# Patient Record
Sex: Female | Born: 1975 | Race: White | Hispanic: No | Marital: Married | Smoking: Never smoker
Health system: Southern US, Community
[De-identification: ages and names within clinical notes are randomized; demographics above are authoritative.]

## PROBLEM LIST (undated history)

## (undated) DIAGNOSIS — F419 Anxiety disorder, unspecified: Secondary | ICD-10-CM

## (undated) DIAGNOSIS — I10 Essential (primary) hypertension: Secondary | ICD-10-CM

---

## 2012-04-30 ENCOUNTER — Ambulatory Visit: Payer: Self-pay | Admitting: Obstetrics and Gynecology

## 2013-12-26 ENCOUNTER — Observation Stay: Payer: Self-pay | Admitting: Obstetrics & Gynecology

## 2013-12-26 LAB — PIH PROFILE
Anion Gap: 5 — ABNORMAL LOW (ref 7–16)
BUN: 8 mg/dL (ref 7–18)
CREATININE: 0.59 mg/dL — AB (ref 0.60–1.30)
Calcium, Total: 9.2 mg/dL (ref 8.5–10.1)
Chloride: 106 mmol/L (ref 98–107)
Co2: 25 mmol/L (ref 21–32)
EGFR (African American): >60
EGFR (Non-African Amer.): >60
Glucose: 94 mg/dL (ref 65–99)
HCT: 33.5 % — ABNORMAL LOW (ref 35.0–47.0)
HGB: 11.1 g/dL — ABNORMAL LOW (ref 12.0–16.0)
MCH: 28.9 pg (ref 26.0–34.0)
MCHC: 33.2 g/dL (ref 32.0–36.0)
MCV: 87 fL (ref 80–100)
Osmolality: 270 (ref 275–301)
Platelet: 198 10*3/uL (ref 150–440)
Potassium: 4.5 mmol/L (ref 3.5–5.1)
RBC: 3.85 10*6/uL (ref 3.80–5.20)
RDW: 16.7 % — ABNORMAL HIGH (ref 11.5–14.5)
SGOT(AST): 21 U/L (ref 15–37)
Sodium: 136 mmol/L (ref 136–145)
Uric Acid: 3.7 mg/dL (ref 2.6–6.0)
WBC: 10.7 10*3/uL (ref 3.6–11.0)

## 2013-12-26 LAB — PROTEIN / CREATININE RATIO, URINE
Creatinine, Urine: 57.4 mg/dL (ref 30.0–125.0)
PROTEIN, RANDOM URINE: 19 mg/dL — AB (ref 0–12)
Protein/Creat. Ratio: 331 mg/gCREAT — ABNORMAL HIGH (ref 0–200)

## 2013-12-28 ENCOUNTER — Observation Stay: Payer: Self-pay | Admitting: Obstetrics and Gynecology

## 2013-12-28 LAB — CBC
HCT: 33.9 % — ABNORMAL LOW (ref 35.0–47.0)
HGB: 11.3 g/dL — ABNORMAL LOW (ref 12.0–16.0)
MCH: 29.1 pg (ref 26.0–34.0)
MCHC: 33.3 g/dL (ref 32.0–36.0)
MCV: 87 fL (ref 80–100)
PLATELETS: 206 10*3/uL (ref 150–440)
RBC: 3.87 10*6/uL (ref 3.80–5.20)
RDW: 16.7 % — ABNORMAL HIGH (ref 11.5–14.5)
WBC: 9.9 10*3/uL (ref 3.6–11.0)

## 2013-12-28 LAB — PROTEIN / CREATININE RATIO, URINE
CREATININE, URINE: 88.4 mg/dL (ref 30.0–125.0)
PROTEIN, RANDOM URINE: 21 mg/dL — AB (ref 0–12)
PROTEIN/CREAT. RATIO: 238 mg/g{creat} — AB (ref 0–200)

## 2013-12-28 LAB — PROTEIN, URINE, 24 HOUR
Collection Hours: 24 hours
Protein, 24 Hour Urine: 530 mg/24HR — ABNORMAL HIGH (ref 30–149)
Protein, Urine: 20 mg/dL (ref 0–12)
Total Volume: 2650 mL

## 2013-12-29 ENCOUNTER — Inpatient Hospital Stay: Payer: Self-pay

## 2013-12-29 LAB — PIH PROFILE
ANION GAP: 10 (ref 7–16)
BUN: 8 mg/dL (ref 7–18)
CALCIUM: 9.5 mg/dL (ref 8.5–10.1)
Chloride: 105 mmol/L (ref 98–107)
Co2: 23 mmol/L (ref 21–32)
Creatinine: 0.59 mg/dL — ABNORMAL LOW (ref 0.60–1.30)
EGFR (African American): >60
EGFR (Non-African Amer.): >60
Glucose: 106 mg/dL — ABNORMAL HIGH (ref 65–99)
HCT: 32.4 % — AB (ref 35.0–47.0)
HGB: 10.8 g/dL — AB (ref 12.0–16.0)
MCH: 29 pg (ref 26.0–34.0)
MCHC: 33.4 g/dL (ref 32.0–36.0)
MCV: 87 fL (ref 80–100)
Osmolality: 274 (ref 275–301)
POTASSIUM: 4 mmol/L (ref 3.5–5.1)
Platelet: 196 10*3/uL (ref 150–440)
RBC: 3.74 10*6/uL — ABNORMAL LOW (ref 3.80–5.20)
RDW: 16.5 % — ABNORMAL HIGH (ref 11.5–14.5)
SGOT(AST): 20 U/L (ref 15–37)
SODIUM: 138 mmol/L (ref 136–145)
Uric Acid: 3.7 mg/dL (ref 2.6–6.0)
WBC: 9.7 10*3/uL (ref 3.6–11.0)

## 2013-12-31 LAB — PLATELET COUNT: Platelet: 197 10*3/uL (ref 150–440)

## 2014-01-02 LAB — HEMATOCRIT: HCT: 27.7 % — AB (ref 35.0–47.0)

## 2014-01-02 LAB — PROT IMMUNOELECT,UR-24HR

## 2014-11-01 NOTE — Op Note (Signed)
PATIENT NAME:  Mary Clayton, Mary Clayton MR#:  [id number] DATE OF BIRTH:  21-Aug-1975  DATE OF PROCEDURE:  01/01/2014  PREOPERATIVE DIAGNOSES:  1.  Intrauterine pregnancy at 40 weeks 6 days gestational age.  2.  Secondary arrest of arrest of dilatation.   POSTOPERATIVE DIAGNOSES: 1.  Intrauterine pregnancy at 40 weeks 6 days gestational age.  2.  Secondary arrest of arrest of dilatation.   PROCEDURE: Primary low transverse cesarean section via Pfannenstiel incision.   ANESTHESIA: Spinal.   SURGEON: Thomasene MohairStephen Jackson, M.D.   ASSISTANT SURGEON:  Farrel Connersolleen Gutierrez.   ESTIMATED BLOOD LOSS: 800 mL.   OPERATIVE FLUIDS: 1100 mL crystalloid.   COMPLICATIONS: None.   FINDINGS:  1.  Normal-appearing gravid uterus, fallopian tubes, and ovaries.  2.  Meconium amniotic fluid.  3.  Viable female infant with Apgars 9 and 9 at 1 and 5 minutes respectively with a weight of 9 pounds 2 ounces or 4130 grams.   SPECIMENS: None.   CONDITION AT THE END OF THE PROCEDURE: Stable.   PROCEDURE IN DETAIL: The patient was taken to the Operating Room where spinal anesthesia was administered and found to be adequate. She was placed in the dorsal supine position with a leftward tilt, and prepped and draped in the usual sterile fashion. After a timeout was called and the patient's abdomen was tested for good pain control,  a Pfannenstiel incision was made with the scalpel and carried through the various layers until the peritoneum was identified and entered sharply. After extending the peritoneal incision, a bladder flap was created and a bladder retractor was used to pull the bladder out of the operative area of interest. The fetal vertex was grasped, elevated to the hysterotomy, and the head followed by the shoulders and the rest of the body were delivered with fundal pressure without difficulty. The cord was clamped and cut and the infant was handed to the pediatrician. The infant was vigorous and crying at the time of  delivery. Cord blood was collected. The placenta was delivered spontaneously intact with a three vessel cord. The uterus was exteriorized and cleared of all clots and debris. The hysterotomy was closed using #0 Vicryl in a running locked fashion. A second layer was used to obtain hemostasis in an imbricating fashion. The uterus was returned to the abdomen and the gutters were cleared of all clots and debris. The peritoneum was reapproximated using #0 Vicryl in a running fashion.   The On-Q pump catheters were placed according to the manufacturer's recommendations. They were placed approximately 4 cm cephalad to the incision line, approximately 1 cm apart, straddling the midline. They were inserted to a depth of approximately the fourth marking on the catheters and positioned just superficial to the rectus abdominis muscles and just deep to the rectus fascia. The fascia was then closed using #1-0 looped PDS in a running fashion. The subcutaneous tissue was reapproximated using #2-0 plain gut, such that no greater than 2 cm of dead space remained. The skin was closed using #4-0 Monocryl in a subcuticular fashion. This incision was reinforced using benzoin and Steri-Strips.   The On-Q catheters were affixed to the skin using Dermabond, Steri-Strips, and Tegaderm. Each catheter was bolused with 5 mL 0.5% Marcaine plain for a total of 10 mL.   The patient tolerated the procedure well. Sponge, lap, and needle counts were correct x 2. For antibiotic prophylaxis, the patient received 3 grams of Ancef prior to skin incision. For VTE prophylaxis, the patient was wearing pneumatic  compression stockings which were on and operating throughout the entire procedure. She was taken to the recovery area in stable condition.    ____________________________ Conard Novak, MD sdj:ts D: 01/01/2014 09:24:16 ET T: 01/01/2014 11:40:19 ET JOB#: [id number]  cc: Conard Novak, MD, <Dictator> Conard Novak  MD ELECTRONICALLY SIGNED 01/18/2014 11:53

## 2014-11-18 NOTE — H&P (Signed)
L&D Evaluation:  History:  HPI 39 year old G1 P0 with EDC=12/26/2013 by a 6wk1d ultrasound presents for NST today.  Patient with elevated BP in clinic 12/26/13 normotensive here on L&D an P/C ratio 300.  Presents for follow up PIH panel drop of 24-hr urine as well.  No HA vision changes, RUQ or epigastric pain.  Has been checking BP's and normotensive.  Urine dipstick in office was negative. Patient deneis headaches, visual changes, epigastric or upper abdominal pain, CP or SOB. Baby has been active. Feels an occasional ctx. No VB, no LOF. [facility] for AMA (negative NIPT-XX chromosomes), a normal anatomy scan, and obesity. LABS: O POS/RNI/Varicella equivocal/ GBS negative.   Presents with elevated blood pressure in office   Patient's Medical History PCOS, obesity (BMI>30)   Patient's Surgical History none   Medications Pre Natal Vitamins   Allergies NKDA   Social History Former smoker   Family History Non-Contributory   ROS:  ROS All systems were reviewed.  HEENT, CNS, GI, GU, Respiratory, CV, Renal and Musculoskeletal systems were found to be normal., see HPI except-hip pains in late pregnancy   Exam:  Vital Signs stable   Urine Protein PRO/CR=238   General no apparent distress   Mental Status clear   Chest clear   Heart normal sinus rhythm, no murmur/gallop/rubs   Abdomen gravid, non-tender   Estimated Fetal Weight Average for gestational age   Fetal Position cephalic   Edema no edema   Reflexes 2+   Pelvic no external lesions, closed/30/hi   Mebranes Intact   FHT normal rate with no decels, 140, mod, +accels, no decels   Ucx occ   Skin dry   Impression:  Impression reactive NST, IUP at 40 weeks with normal blood pressures today normal PC ratio today   Plan:  Comments - no evidence of PIH - IOL scheduled 12/31/13 - reactive NST reassuring fetal suveillance   Electronic Signatures: Lorrene ReidStaebler, Andreas M (MD)  (Signed 20-Jun-15  15:34)  Authored: L&D Evaluation   Last Updated: 20-Jun-15 15:34 by Lorrene ReidStaebler, Andreas M (MD)

## 2014-11-18 NOTE — H&P (Signed)
L&D Evaluation:  History:  HPI 39 year old G1 P0 with EDC=12/26/2013 by a 6wk1d ultrasound presents from office with mild BP elevations for a PIH evaluation. Urine dipstick in office was negative. Patient deneis headaches, visual changes, epigastric or upper abdominal pain, CP or SOB. Baby has been active. Feels an occasional ctx. No VB. [facility] remarkable for AMA (negative NIPT-XX chromosomes), a normal anatomy scan, and obesity. LABS: O POS/RNI/Varicella equivocal/ GBS negative.   Presents with elevated blood pressure in office   Patient's Medical History PCOS, obesity (BMI>30)   Patient's Surgical History none   Medications Pre Natal Vitamins   Allergies NKDA   Social History Former smoker   Family History Non-Contributory   ROS:  ROS All systems were reviewed.  HEENT, CNS, GI, GU, Respiratory, CV, Renal and Musculoskeletal systems were found to be normal., see HPI except-hip pains in late pregnancy   Exam:  Vital Signs 136/81, 119/71, 130/76, 123/74, 123/70, 128/67   Urine Protein PRO/CR=331 mgm   General no apparent distress   Mental Status clear   Chest clear   Heart normal sinus rhythm, no murmur/gallop/rubs   Abdomen gravid, non-tender   Estimated Fetal Weight Average for gestational age   Fetal Position cephalic   Edema no edema   Reflexes 2+   Pelvic no external lesions, closed/60-70%/-1   Mebranes Intact   FHT normal rate with no decels, 135-140 with accels to 160s. Cat1   Ucx occ   Skin dry   Other H&H=11.1/33.5%, plt=198, BUN/cr=8/0.59, SGOT=WNL. UA=3.7.   Impression:  Impression reactive NST, IUP at 40 weeks with normal blood pressures and borderline elevated PRO/CR ratioo   Plan:  Plan DC home with preeclampsia and labor precautions. Collect 24 hr urine for total protein tomorrow and bring in on Sat to L&D. NST and BP check on Sat at L&D. Scheduled for IOL on 23 June if labs and BP WNL.   Electronic Signatures: Trinna BalloonGutierrez,  Colleen L (CNM)  (Signed 18-Jun-15 15:32)  Authored: L&D Evaluation   Last Updated: 18-Jun-15 15:32 by Trinna BalloonGutierrez, Colleen L (CNM)

## 2014-11-18 NOTE — H&P (Signed)
L&D Evaluation:  History:  HPI 39 year old G1 P0 at 10175w3d by 6wk US derived EDC=12/26/2013 presenting for IOL secondary to mild preeclampsia.  Patient with elevated BP in clinic 12/26/13 normotensive here on L&D an P/C ratio 300.  Presented for repeat NST on 6/20 with PC ratio of 238 and normal PIH panel, reassuring fetal monitoring.  Presents for follow up PIH panel drop of 24-hr urine as well.  24-hr urine eventually returned elevated the following day and she was called to set up IOL as she is past 37 weeks.  She originally was scheduled for IOL on 6/23. No HA vision changes, RUQ or epigastric pain.  Has been checking BP's and normotensive. Patient deneis headaches, visual changes, epigastric or upper abdominal pain, CP or SOB. Baby has been active. Feels an occasional ctx. No VB, no LOF. [facility] remarkable for AMA (negative NIPT-XX chromosomes), a normal anatomy scan, and obesity. LABS: O POS/RNI/Varicella equivocal/ GBS negative.   Presents with elevated blood pressure in office   Patient's Medical History PCOS, obesity (BMI>30)   Patient's Surgical History none   Medications Pre Natal Vitamins   Allergies NKDA   Social History Former smoker   Family History Non-Contributory   ROS:  ROS All systems were reviewed.  HEENT, CNS, GI, GU, Respiratory, CV, Renal and Musculoskeletal systems were found to be normal., see HPI except-hip pains in late pregnancy   Exam:  Vital Signs stable   General no apparent distress   Mental Status clear   Chest clear   Heart normal sinus rhythm, no murmur/gallop/rubs   Abdomen gravid, non-tender   Estimated Fetal Weight Average for gestational age   Fetal Position cephalic   Edema no edema   Reflexes 2+   Pelvic no external lesions, closed/30/hi   Mebranes Intact   FHT normal rate with no decels, 140, mod, +accels, no decels   Ucx occ   Skin dry   Impression:  Impression reactive NST, IUP at 40.3 weeks with mild  preeclampsia   Plan:  Plan EFM/NST, monitor BP   Comments 1) IOL mild preeclampsia      - PIH panel on admission     - Monitor BP's     - cervidil this evening  2) Fetus - category I tracing     - 23lbs weight gain this pregnancy  3) PNL - O pos / ABSC neg / RNI / VZNI / HIV neg / HBsAg neg / RPR NR / cell free fetal DNA normal XX / 1-hr 122 / GBS negative  4) TDAP received 11/05/2013  5) Disposition - pending delivery   Electronic Signatures: Lorrene ReidStaebler, Andreas M (MD)  (Signed 21-Jun-15 20:49)  Authored: L&D Evaluation   Last Updated: 21-Jun-15 20:49 by Lorrene ReidStaebler, Andreas M (MD)

## 2016-12-03 ENCOUNTER — Emergency Department
Admission: EM | Admit: 2016-12-03 | Discharge: 2016-12-03 | Disposition: A | Discharge status: Home / Self Care | Payer: 59 | Attending: Emergency Medicine | Admitting: Emergency Medicine

## 2016-12-03 ENCOUNTER — Encounter: Payer: Self-pay | Admitting: Emergency Medicine

## 2016-12-03 DIAGNOSIS — J020 Streptococcal pharyngitis: Secondary | ICD-10-CM | POA: Insufficient documentation

## 2016-12-03 DIAGNOSIS — J029 Acute pharyngitis, unspecified: Secondary | ICD-10-CM | POA: Diagnosis present

## 2016-12-03 LAB — POCT RAPID STREP A: STREPTOCOCCUS, GROUP A SCREEN (DIRECT): POSITIVE — AB

## 2016-12-03 MED ORDER — LIDOCAINE VISCOUS 2 % MT SOLN: 10.0000 mL | OROMUCOSAL | 0 refills | Status: DC | PRN

## 2016-12-03 MED ORDER — AMOXICILLIN 500 MG PO CAPS
ORAL_CAPSULE | ORAL | Status: AC
  Filled 2016-12-03: qty 1

## 2016-12-03 MED ORDER — LIDOCAINE VISCOUS 2 % MT SOLN
15.0000 mL | Freq: Once | OROMUCOSAL | Status: AC
  Administered 2016-12-03: 15 mL via OROMUCOSAL

## 2016-12-03 MED ORDER — AMOXICILLIN 500 MG PO CAPS
500.0000 mg | ORAL_CAPSULE | Freq: Once | ORAL | Status: AC
  Administered 2016-12-03: 500 mg via ORAL

## 2016-12-03 MED ORDER — AMOXICILLIN 500 MG PO CAPS: 500.0000 mg | ORAL_CAPSULE | Freq: Two times a day (BID) | ORAL | 0 refills | Status: DC

## 2016-12-03 MED ORDER — LIDOCAINE VISCOUS 2 % MT SOLN
OROMUCOSAL | Status: AC
  Filled 2016-12-03: qty 15

## 2016-12-03 NOTE — ED Notes (Signed)
POCT STREP TEST "A" was **POSITIVE**

## 2016-12-03 NOTE — ED Provider Notes (Signed)
[facility] Emergency Department Provider Note  ____________________________________________  Time seen: Approximately 8:52 PM  I have reviewed the triage vital signs and the nursing notes.   HISTORY  Chief Complaint Sore Throat    HPI Mary Clayton is a 41 y.o. female that presents to emergency department with sore throat for 3 days. She states that she has not had a fever, which is why she has not come to the emergency department already. She has strep and this spring frequently. She denies sick contacts. She is not allergic to any antibiotics. She denies fever, nasal congestion, cough, shortness of breath, chest pain, nausea, vomiting, abdominal pain.   History reviewed. No pertinent past medical history.  There are no active problems to display for this patient.   History reviewed. No pertinent surgical history.  Prior to Admission medications   Medication Sig Start Date End Date Taking? Authorizing Provider  amoxicillin (AMOXIL) 500 MG capsule Take 1 capsule (500 mg total) by mouth 2 (two) times daily. 12/03/16   Enid DerryWagner, Kehlani Vancamp, PA-C  lidocaine (XYLOCAINE) 2 % solution Use as directed 10 mLs in the mouth or throat as needed for mouth pain. 12/03/16   Enid DerryWagner, Maclain Cohron, PA-C    Allergies Patient has no known allergies.  History reviewed. No pertinent family history.  Social History Social History  Substance Use Topics  . Smoking status: Never Smoker  . Smokeless tobacco: Never Used  . Alcohol use No     Review of Systems  Constitutional: No fever/chills Eyes: No visual changes. No discharge. ENT: Negative for congestion and rhinorrhea. Positive for sore throat. Cardiovascular: No chest pain. Respiratory: Negative for cough. No SOB. Gastrointestinal: No abdominal pain.  No nausea, no vomiting.  No diarrhea.  No constipation. Musculoskeletal: Negative for musculoskeletal pain. Skin: Negative for rash, abrasions, lacerations,  ecchymosis.   ____________________________________________   PHYSICAL EXAM:  VITAL SIGNS: ED Triage Vitals [12/03/16 2035]  Enc Vitals Group     BP 130/77     Pulse Rate 87     Resp 15     Temp 98.6 F (37 C)     Temp Source Oral     SpO2 97 %     Weight 268 lb (121.6 kg)     Height 5\' 8"  (1.727 m)     Head Circumference      Peak Flow      Pain Score      Pain Loc      Pain Edu?      Excl. in GC?      Constitutional: Alert and oriented. Well appearing and in no acute distress. Eyes: Conjunctivae are normal. PERRL. EOMI. No discharge. Head: Atraumatic. ENT: No frontal and maxillary sinus tenderness.      Ears: Tympanic membranes pearly gray with good landmarks. No discharge.      Nose: No congestion/rhinnorhea.      Mouth/Throat: Mucous membranes are moist. Oropharynx erythematous. Tonsils enlarged bilaterally. No exudates. Uvula midline. Neck: No stridor.   Hematological/Lymphatic/Immunilogical: No cervical lymphadenopathy. Cardiovascular: Normal rate, regular rhythm.  Good peripheral circulation. Respiratory: Normal respiratory effort without tachypnea or retractions. Lungs CTAB. Good air entry to the bases with no decreased or absent breath sounds. Gastrointestinal: Bowel sounds 4 quadrants. Soft and nontender to palpation. No guarding or rigidity. No palpable masses. No distention. Musculoskeletal: Full range of motion to all extremities. No gross deformities appreciated. Neurologic:  Normal speech and language. No gross focal neurologic deficits are appreciated.  Skin:  Skin  is warm, dry and intact. No rash noted.   ____________________________________________   LABS (all labs ordered are listed, but only abnormal results are displayed)  Labs Reviewed  POCT RAPID STREP A - Abnormal; Notable for the following:       Result Value   Streptococcus, Group A Screen (Direct) POSITIVE (*)    All other components within normal limits    ____________________________________________  EKG   ____________________________________________  RADIOLOGY   No results found.  ____________________________________________    PROCEDURES  Procedure(s) performed:    Procedures    Medications  amoxicillin (AMOXIL) capsule 500 mg (500 mg Oral Given 12/03/16 2150)  lidocaine (XYLOCAINE) 2 % viscous mouth solution 15 mL (15 mLs Mouth/Throat Given 12/03/16 2150)     ____________________________________________   INITIAL IMPRESSION / ASSESSMENT AND PLAN / ED COURSE  Pertinent labs & imaging results that were available during my care of the patient were reviewed by me and considered in my medical decision making (see chart for details).  Review of the [place] CSRS was performed in accordance of the [facility] prior to dispensing any controlled drugs.     Patient's diagnosis is consistent with strep pharyngitis. Vital signs and exam are reassuring. Strep test positive. Patient appears well and is staying well hydrated. She was given a dose of amoxicillin and viscous lidocaine in ED. Patient feels comfortable going home. Patient will be discharged home with prescriptions for amoxicillin and viscous lidocaine. Patient is to follow up with PCP as needed or otherwise directed. Patient is given ED precautions to return to the ED for any worsening or new symptoms.     ____________________________________________  FINAL CLINICAL IMPRESSION(S) / ED DIAGNOSES  Final diagnoses:  Strep throat      NEW MEDICATIONS STARTED DURING THIS VISIT:  Discharge Medication List as of 12/03/2016 10:13 PM    START taking these medications   Details  amoxicillin (AMOXIL) 500 MG capsule Take 1 capsule (500 mg total) by mouth 2 (two) times daily., Starting Sat 12/03/2016, Print    lidocaine (XYLOCAINE) 2 % solution Use as directed 10 mLs in the mouth or throat as needed for mouth pain., Starting Sat 12/03/2016, Print            This chart  was dictated using voice recognition software/Dragon. Despite best efforts to proofread, errors can occur which can change the meaning. Any change was purely unintentional.    Enid Derry, PA-C 12/03/16 1610    Merrily Brittle, MD 12/04/16 [phone]

## 2016-12-03 NOTE — ED Triage Notes (Signed)
Pt ambulatory to triage with steady gait, no distress noted. Pt c/o sore throat x3 days. Pt denies fever.

## 2016-12-26 DIAGNOSIS — R7303 Prediabetes: Secondary | ICD-10-CM | POA: Insufficient documentation

## 2018-03-17 DIAGNOSIS — F410 Panic disorder [episodic paroxysmal anxiety] without agoraphobia: Secondary | ICD-10-CM | POA: Insufficient documentation

## 2018-03-17 DIAGNOSIS — I10 Essential (primary) hypertension: Secondary | ICD-10-CM | POA: Insufficient documentation

## 2018-05-28 ENCOUNTER — Other Ambulatory Visit: Payer: Self-pay

## 2018-05-28 ENCOUNTER — Emergency Department: Payer: 59

## 2018-05-28 ENCOUNTER — Encounter: Payer: Self-pay | Admitting: *Deleted

## 2018-05-28 ENCOUNTER — Emergency Department
Admission: EM | Admit: 2018-05-28 | Discharge: 2018-05-28 | Disposition: A | Discharge status: Home / Self Care | Payer: 59 | Attending: Emergency Medicine | Admitting: Emergency Medicine

## 2018-05-28 DIAGNOSIS — J40 Bronchitis, not specified as acute or chronic: Secondary | ICD-10-CM

## 2018-05-28 DIAGNOSIS — J209 Acute bronchitis, unspecified: Secondary | ICD-10-CM | POA: Insufficient documentation

## 2018-05-28 MED ORDER — PREDNISONE 10 MG PO TABS: ORAL_TABLET | ORAL | 0 refills | Status: DC

## 2018-05-28 MED ORDER — DOXYCYCLINE HYCLATE 50 MG PO CAPS: 100.0000 mg | ORAL_CAPSULE | Freq: Two times a day (BID) | ORAL | 0 refills | Status: AC

## 2018-05-28 NOTE — ED Notes (Signed)
See triage note  States she has had a cold for about a weeks  conts to have cough  States cough is occasional prod  Unsure of fever   States may have had low grade fever yesterday

## 2018-05-28 NOTE — ED Triage Notes (Signed)
Pt ambulatory to triage.  Pt has a cough since last week.  Non smoker.  Reports coughing up green phlegm and yellow phlegm.  Pt alert.

## 2018-05-28 NOTE — ED Provider Notes (Signed)
[facility] Emergency Department Provider Note  ____________________________________________  Time seen: Approximately 6:20 PM  I have reviewed the triage vital signs and the nursing notes.   HISTORY  Chief Complaint Cough    HPI Mary Clayton is a 42 y.o. female that presents to the emergency department for evaluation of worsening productive cough for one week. Cough worsened today. She has had green mucus production several times per day. She had a low grade fever yesterday. 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 She does not smoke. No history of asthma. No SOB, CP, nausea, vomiting, abdominal pain.    No past medical history on file.  There are no active problems to display for this patient.   No past surgical history on file.  Prior to Admission medications   Medication Sig Start Date End Date Taking? Authorizing Provider  amoxicillin (AMOXIL) 500 MG capsule Take 1 capsule (500 mg total) by mouth 2 (two) times daily. 12/03/16   Enid DerryWagner, Aamya Orellana, PA-C  doxycycline (VIBRAMYCIN) 50 MG capsule Take 2 capsules (100 mg total) by mouth 2 (two) times daily for 10 days. 05/28/18 06/07/18  Enid DerryWagner, Jayzen Paver, PA-C  lidocaine (XYLOCAINE) 2 % solution Use as directed 10 mLs in the mouth or throat as needed for mouth pain. 12/03/16   Enid DerryWagner, Lakie Mclouth, PA-C  predniSONE (DELTASONE) 10 MG tablet Take 6 tablets day 1, take 5 tablets day 2, take 4 tablets day 3, take 3 tablets day 4, take 2 tablets day 5, take 1 tablet day 6 05/28/18   Enid DerryWagner, Juandaniel Manfredo, PA-C    Allergies Patient has no known allergies.  No family history on file.  Social History Social History   Tobacco Use  . Smoking status: Never Smoker  . Smokeless tobacco: Never Used  Substance Use Topics  . Alcohol use: No  . Drug use: No     Review of Systems  Constitutional: Positive for low grade fever. Eyes: No visual changes. No discharge. ENT: Negative for  congestion and rhinorrhea. Cardiovascular: No chest pain. Respiratory: Positive for cough. No SOB. Gastrointestinal: No abdominal pain.  No nausea, no vomiting.  Musculoskeletal: Negative for musculoskeletal pain. Skin: Negative for rash, abrasions, lacerations, ecchymosis. Neurological: Negative for headaches.   ____________________________________________   PHYSICAL EXAM:  VITAL SIGNS: ED Triage Vitals  Enc Vitals Group     BP 05/28/18 1724 (!) 152/83     Pulse Rate 05/28/18 1724 75     Resp --      Temp 05/28/18 1724 98.5 F (36.9 C)     Temp Source 05/28/18 1724 Oral     SpO2 05/28/18 1724 98 %     Weight 05/28/18 1725 260 lb (117.9 kg)     Height 05/28/18 1725 5\' 9"  (1.753 m)     Head Circumference --      Peak Flow --      Pain Score 05/28/18 1731 0     Pain Loc --      Pain Edu? --      Excl. in GC? --      Constitutional: Alert and oriented. Well appearing and in no acute distress. Eyes: Conjunctivae are normal. PERRL. EOMI. No discharge. Head: Atraumatic. ENT: No frontal and maxillary sinus tenderness.      Ears: Tympanic membranes pearly gray with good landmarks. No discharge.      Nose: Mild congestion/rhinnorhea.      Mouth/Throat: Mucous membranes are moist. Oropharynx non-erythematous.  Tonsils not enlarged. No exudates. Uvula midline. Neck: No stridor.   Hematological/Lymphatic/Immunilogical: No cervical lymphadenopathy. Cardiovascular: Normal rate, regular rhythm.  Good peripheral circulation. Respiratory: Normal respiratory effort without tachypnea or retractions. Lungs CTAB. Good air entry to the bases with no decreased or absent breath sounds. Gastrointestinal: Bowel sounds 4 quadrants. Soft and nontender to palpation. No guarding or rigidity. No palpable masses. No distention. Musculoskeletal: Full range of motion to all extremities. No gross deformities appreciated. Neurologic:  Normal speech and language. No gross focal neurologic deficits are  appreciated.  Skin:  Skin is warm, dry and intact. No rash noted. Psychiatric: Mood and affect are normal. Speech and behavior are normal. Patient exhibits appropriate insight and judgement.   ____________________________________________   LABS (all labs ordered are listed, but only abnormal results are displayed)  Labs Reviewed - No data to display ____________________________________________  EKG   ____________________________________________  RADIOLOGY Lexine Baton, personally viewed and evaluated these images (plain radiographs) as part of my medical decision making, as well as reviewing the written report by the radiologist.  Dg Chest 2 View  Result Date: 05/28/2018 CLINICAL DATA:  Cough since last week EXAM: CHEST - 2 VIEW COMPARISON:  None. FINDINGS: The heart size and mediastinal contours are within normal limits. Mild upper lobe predominant hyperinflation of the lungs with crowding of lower lobe interstitial lung markings. No alveolar confluence or edema. No pneumothorax or pleural effusion. The visualized skeletal structures are unremarkable. IMPRESSION: Mild upper lobe predominant hyperinflation of the lungs. Electronically Signed   By: Tollie Eth M.D.   On: 05/28/2018 18:08    ____________________________________________    PROCEDURES  Procedure(s) performed:    Procedures    Medications - No data to display   ____________________________________________   INITIAL IMPRESSION / ASSESSMENT AND PLAN / ED COURSE  Pertinent labs & imaging results that were available during my care of the patient were reviewed by me and considered in my medical decision making (see chart for details).  Review of the [place] CSRS was performed in accordance of the [facility] prior to dispensing any controlled drugs.   Patient's diagnosis is consistent with bronchitis. Vital signs and exam are reassuring. Patient appears well and is staying well hydrated. Patient should alternate  tylenol and ibuprofen for fever. Patient feels comfortable going home. Patient will be discharged home with prescriptions for prednisone and azithromycin. Patient is to follow up with PCP as needed or otherwise directed. Patient is given ED precautions to return to the ED for any worsening or new symptoms.     ____________________________________________  FINAL CLINICAL IMPRESSION(S) / ED DIAGNOSES  Final diagnoses:  Bronchitis      NEW MEDICATIONS STARTED DURING THIS VISIT:  ED Discharge Orders         Ordered    doxycycline (VIBRAMYCIN) 50 MG capsule  2 times daily     05/28/18 1850    predniSONE (DELTASONE) 10 MG tablet     05/28/18 1850              This chart was dictated using voice recognition software/Dragon. Despite best efforts to proofread, errors can occur which can change the meaning. Any change was purely unintentional.    Enid Derry, PA-C 05/28/18 1909    Dionne Bucy, MD 06/05/18 [phone]

## 2018-11-20 DIAGNOSIS — N946 Dysmenorrhea, unspecified: Secondary | ICD-10-CM | POA: Insufficient documentation

## 2019-05-30 DIAGNOSIS — Z0000 Encounter for general adult medical examination without abnormal findings: Secondary | ICD-10-CM | POA: Insufficient documentation

## 2019-06-12 ENCOUNTER — Ambulatory Visit
Admission: EM | Admit: 2019-06-12 | Discharge: 2019-06-12 | Disposition: A | Discharge status: Home / Self Care | Payer: BC Managed Care – PPO | Attending: Family Medicine | Admitting: Family Medicine

## 2019-06-12 ENCOUNTER — Other Ambulatory Visit: Payer: Self-pay

## 2019-06-12 ENCOUNTER — Encounter: Payer: Self-pay | Admitting: Emergency Medicine

## 2019-06-12 DIAGNOSIS — J029 Acute pharyngitis, unspecified: Secondary | ICD-10-CM | POA: Diagnosis not present

## 2019-06-12 HISTORY — DX: Essential (primary) hypertension: I10

## 2019-06-12 HISTORY — DX: Anxiety disorder, unspecified: F41.9

## 2019-06-12 LAB — RAPID STREP SCREEN (MED CTR MEBANE ONLY): Streptococcus, Group A Screen (Direct): NEGATIVE

## 2019-06-12 MED ORDER — FLUCONAZOLE 150 MG PO TABS: 150.0000 mg | ORAL_TABLET | Freq: Every day | ORAL | 0 refills | Status: DC

## 2019-06-12 MED ORDER — AMOXICILLIN 875 MG PO TABS: 875.0000 mg | ORAL_TABLET | Freq: Two times a day (BID) | ORAL | 0 refills | Status: DC

## 2019-06-12 NOTE — ED Triage Notes (Signed)
Patient c/o sore throat and white bumps on the back of her throat that started today. Denies any other symptoms.

## 2019-06-12 NOTE — ED Provider Notes (Signed)
[facility] ____________________________________________  Time seen: Approximately 8:38 PM  I have reviewed the triage vital signs and the nursing notes.   HISTORY  Chief Complaint Sore Throat   HPI Mary Clayton is a 43 y.o. female presenting for evaluation of sore throat present for the last 1 day.  States today she noticed white spots on her throat prompting her to come in.  States this feels consistent with her previous strep throat.  States treat for strep throat is recurrent for her.  Denies cough, congestion, chest pain, shortness of breath, vomiting, diarrhea, change in taste or smell.  Denies known sick contacts.  Has continued to eat and drink well.  Denies aggravating alleviating factors.  Declines COVID-19 testing.  Leotis Shames, MD: PCP   Past Medical History:  Diagnosis Date  . Anxiety   . Hypertension     There are no active problems to display for this patient.   History reviewed. No pertinent surgical history.   No current facility-administered medications for this encounter.   Current Outpatient Medications:  .  amLODipine (NORVASC) 10 MG tablet, Take 10 mg by mouth daily., Disp: , Rfl:  .  PARoxetine (PAXIL) 10 MG tablet, Take 10 mg by mouth daily., Disp: , Rfl:  .  PARoxetine (PAXIL) 40 MG tablet, Take 40 mg by mouth daily., Disp: , Rfl:  .  amoxicillin (AMOXIL) 875 MG tablet, Take 1 tablet (875 mg total) by mouth 2 (two) times daily., Disp: 20 tablet, Rfl: 0 .  fluconazole (DIFLUCAN) 150 MG tablet, Take 1 tablet (150 mg total) by mouth daily. Take one pill orally as needed., Disp: 1 tablet, Rfl: 0  Allergies Patient has no known allergies.  Family History  Problem Relation Age of Onset  . Stroke Mother     Social History Social History   Tobacco Use  . Smoking status: Never Smoker  . Smokeless tobacco: Never Used  Substance Use Topics  . Alcohol use: No  . Drug use: No    Review of Systems Constitutional: No fever ENT:  Positive sore throat. Cardiovascular: Denies chest pain. Respiratory: Denies shortness of breath. Gastrointestinal: No abdominal pain.  No nausea, no vomiting.  No diarrhea.  Musculoskeletal: Negative for back pain. Skin: Negative for rash.   ____________________________________________   PHYSICAL EXAM:  VITAL SIGNS: ED Triage Vitals  Enc Vitals Group     BP 06/12/19 1939 (!) 137/106     Pulse Rate 06/12/19 1939 73     Resp 06/12/19 1939 18     Temp 06/12/19 1939 97.8 F (36.6 C)     Temp Source 06/12/19 1939 Oral     SpO2 06/12/19 1939 99 %     Weight 06/12/19 1936 240 lb (108.9 kg)     Height 06/12/19 1936 5\' 9"  (1.753 m)     Head Circumference --      Peak Flow --      Pain Score 06/12/19 1935 5     Pain Loc --      Pain Edu? --      Excl. in GC? --     Constitutional: Alert and oriented. Well appearing and in no acute distress. Eyes: Conjunctivae are normal.  Head: Atraumatic. No swelling. No erythema.  Nose: No nasal congestion.  Mouth/Throat: Mucous membranes are moist. Mild pharyngeal erythema.  2+ bilateral tonsillar swelling with bilateral exudate.  No uvular shift or deviation. Neck: No stridor.  No cervical spine tenderness to palpation. Hematological/Lymphatic/Immunilogical: No cervical lymphadenopathy. Cardiovascular:  Normal rate, regular rhythm. Grossly normal heart sounds.  Good peripheral circulation. Respiratory: Normal respiratory effort.  No retractions. No wheezes, rales or rhonchi. Good air movement.  Musculoskeletal: Ambulatory with steady gait.  Neurologic:  Normal speech and language. No gait instability. Skin:  Skin appears warm, dry and intact. No rash noted. Psychiatric: Mood and affect are normal. Speech and behavior are normal.  ___________________________________________   LABS (all labs ordered are listed, but only abnormal results are displayed)  Labs Reviewed  RAPID STREP SCREEN ([facility] ONLY)  CULTURE, GROUP A STREP [facility])      PROCEDURES Procedures    INITIAL IMPRESSION / ASSESSMENT AND PLAN / ED COURSE  Pertinent labs & imaging results that were available during my care of the patient were reviewed by me and considered in my medical decision making (see chart for details).  Well-appearing patient.  No acute distress.  Exudative tonsillitis.  Strep negative, will culture.  Empirically treated with oral amoxicillin.  Patient request Diflucan, Rx given.  Rest, fluids, gargles, monitor and supportive care.Discussed indication, risks and benefits of medications with patient.   Discussed follow up with Primary care physician this week. Discussed follow up and return parameters including no resolution or any worsening concerns. Patient verbalized understanding and agreed to plan.   ____________________________________________   FINAL CLINICAL IMPRESSION(S) / ED DIAGNOSES  Final diagnoses:  Exudative pharyngitis     ED Discharge Orders         Ordered    amoxicillin (AMOXIL) 875 MG tablet  2 times daily     06/12/19 2002    fluconazole (DIFLUCAN) 150 MG tablet  Daily     06/12/19 2002           Note: This dictation was prepared with Dragon dictation along with smaller phrase technology. Any transcriptional errors that result from this process are unintentional.         Marylene Land, NP 06/12/19 2045

## 2019-06-12 NOTE — Discharge Instructions (Addendum)
Take medication as prescribed. Rest. Drink plenty of fluids.  ° °Follow up with your primary care physician this week as needed. Return to Urgent care for new or worsening concerns.  ° °

## 2019-06-15 LAB — CULTURE, GROUP A STREP (THRC)

## 2020-01-02 ENCOUNTER — Encounter: Payer: Self-pay | Admitting: Emergency Medicine

## 2020-01-02 ENCOUNTER — Other Ambulatory Visit: Payer: Self-pay

## 2020-01-02 ENCOUNTER — Ambulatory Visit
Admission: EM | Admit: 2020-01-02 | Discharge: 2020-01-02 | Disposition: A | Discharge status: Home / Self Care | Payer: BC Managed Care – PPO | Attending: Family Medicine | Admitting: Family Medicine

## 2020-01-02 DIAGNOSIS — R300 Dysuria: Secondary | ICD-10-CM | POA: Diagnosis not present

## 2020-01-02 LAB — URINALYSIS, COMPLETE (UACMP) WITH MICROSCOPIC
Bilirubin Urine: NEGATIVE
Glucose, UA: NEGATIVE mg/dL
Ketones, ur: NEGATIVE mg/dL
Nitrite: NEGATIVE
Protein, ur: NEGATIVE mg/dL
Specific Gravity, Urine: >1.03 — ABNORMAL HIGH (ref 1.005–1.030)
pH: 5.5 (ref 5.0–8.0)

## 2020-01-02 MED ORDER — SULFAMETHOXAZOLE-TRIMETHOPRIM 800-160 MG PO TABS: 1.0000 | ORAL_TABLET | Freq: Two times a day (BID) | ORAL | 0 refills | Status: AC

## 2020-01-02 NOTE — ED Provider Notes (Signed)
[facility]    CSN: [id number] Arrival date & time: 01/02/20  3086  History   Chief Complaint Chief Complaint  Patient presents with   Dysuria   Urinary Frequency   HPI  44 year old female presents with concerns for UTI.  Patient reports that her symptoms started on Monday.  She reports dysuria, frequency, and urgency.  No fever.  No flank pain.  Pain 6/10 in severity.  She has taken ibuprofen without relief.  No other medications or interventions tried.  No other associated symptoms.  No other complaints.   Past Medical History:  Diagnosis Date   Anxiety    Hypertension    Home Medications    Prior to Admission medications   Medication Sig Start Date End Date Taking? Authorizing Provider  amLODipine (NORVASC) 10 MG tablet Take 10 mg by mouth daily. 05/30/19  Yes [provider]  PARoxetine (PAXIL) 10 MG tablet Take 10 mg by mouth daily. 06/01/19  Yes [provider]  PARoxetine (PAXIL) 40 MG tablet Take 40 mg by mouth daily. 05/01/19  Yes [provider]  sulfamethoxazole-trimethoprim (BACTRIM DS) 800-160 MG tablet Take 1 tablet by mouth 2 (two) times daily for 5 days. 01/02/20 01/07/20  Tommie Sams, DO    Family History Family History  Problem Relation Age of Onset   Stroke Mother     Social History Social History   Tobacco Use   Smoking status: Never Smoker   Smokeless tobacco: Never Used  Substance Use Topics   Alcohol use: No   Drug use: No     Allergies   Patient has no known allergies.   Review of Systems Review of Systems  Constitutional: Negative for fever.  Genitourinary: Positive for dysuria, frequency and urgency.   Physical Exam Triage Vital Signs ED Triage Vitals  Enc Vitals Group     BP 01/02/20 0844 124/86     Pulse Rate 01/02/20 0844 68     Resp 01/02/20 0844 18     Temp 01/02/20 0844 98.4 F (36.9 C)     Temp Source 01/02/20 0844 Oral     SpO2 01/02/20 0844 100 %     Weight  01/02/20 0843 240 lb 1.3 oz (108.9 kg)     Height 01/02/20 0843 5\' 9"  (1.753 m)     Head Circumference --      Peak Flow --      Pain Score 01/02/20 0842 6     Pain Loc --      Pain Edu? --      Excl. in GC? --    Updated Vital Signs BP 124/86 (BP Location: Right Arm)    Pulse 68    Temp 98.4 F (36.9 C) (Oral)    Resp 18    Ht 5\' 9"  (1.753 m)    Wt 108.9 kg    LMP 12/26/2019    SpO2 100%    BMI 35.45 kg/m   Visual Acuity Right Eye Distance:   Left Eye Distance:   Bilateral Distance:    Right Eye Near:   Left Eye Near:    Bilateral Near:     Physical Exam Vitals and nursing note reviewed.  Constitutional:      General: She is not in acute distress.    Appearance: Normal appearance. She is not ill-appearing.  HENT:     Head: Normocephalic and atraumatic.  Eyes:     General:        Right eye: No discharge.  Left eye: No discharge.     Conjunctiva/sclera: Conjunctivae normal.  Cardiovascular:     Rate and Rhythm: Normal rate and regular rhythm.  Pulmonary:     Effort: Pulmonary effort is normal.     Breath sounds: Normal breath sounds. No wheezing or rales.  Abdominal:     General: There is no distension.     Palpations: Abdomen is soft.     Tenderness: There is no abdominal tenderness.  Neurological:     Mental Status: She is alert.  Psychiatric:        Mood and Affect: Mood normal.        Behavior: Behavior normal.    UC Treatments / Results  Labs (all labs ordered are listed, but only abnormal results are displayed) Labs Reviewed  URINALYSIS, COMPLETE (UACMP) WITH MICROSCOPIC - Abnormal; Notable for the following components:      Result Value   APPearance CLOUDY (*)    Specific Gravity, Urine >1.030 (*)    Hgb urine dipstick SMALL (*)    Leukocytes,Ua MODERATE (*)    Bacteria, UA RARE (*)    All other components within normal limits  URINE CULTURE    EKG   Radiology No results found.  Procedures Procedures (including critical care  time)  Medications Ordered in UC Medications - No data to display  Initial Impression / Assessment and Plan / UC Course  I have reviewed the triage vital signs and the nursing notes.  Pertinent labs & imaging results that were available during my care of the patient were reviewed by me and considered in my medical decision making (see chart for details).    44 year old female presents with dysuria and urinary frequency/urgency.  Urinalysis with moderate leukocytes and small hemoglobin.  Microscopy with 11-20 WBCs.  However, patient did give a sample with a lot of squamous epithelial cells.  Placing empirically on Bactrim while awaiting culture.  Final Clinical Impressions(s) / UC Diagnoses   Final diagnoses:  Dysuria   Discharge Instructions   None    ED Prescriptions    Medication Sig Dispense Auth. Provider   sulfamethoxazole-trimethoprim (BACTRIM DS) 800-160 MG tablet Take 1 tablet by mouth 2 (two) times daily for 5 days. 10 tablet Coral Spikes, DO     PDMP not reviewed this encounter.   Thersa Salt [place], [place] 01/02/20 [phone]

## 2020-01-02 NOTE — ED Triage Notes (Signed)
Patient c/o dysuria and urinary frequency that started 3-4 days ago.

## 2020-01-03 LAB — URINE CULTURE

## 2020-04-25 ENCOUNTER — Encounter: Payer: Self-pay | Admitting: Emergency Medicine

## 2020-04-25 ENCOUNTER — Ambulatory Visit
Admission: EM | Admit: 2020-04-25 | Discharge: 2020-04-25 | Disposition: A | Discharge status: Home / Self Care | Payer: BC Managed Care – PPO | Attending: Student | Admitting: Student

## 2020-04-25 ENCOUNTER — Other Ambulatory Visit: Payer: Self-pay

## 2020-04-25 DIAGNOSIS — J039 Acute tonsillitis, unspecified: Secondary | ICD-10-CM | POA: Diagnosis present

## 2020-04-25 DIAGNOSIS — J029 Acute pharyngitis, unspecified: Secondary | ICD-10-CM | POA: Diagnosis not present

## 2020-04-25 LAB — GROUP A STREP BY PCR: Group A Strep by PCR: NOT DETECTED

## 2020-04-25 MED ORDER — FLUCONAZOLE 150 MG PO TABS: 150.0000 mg | ORAL_TABLET | Freq: Every day | ORAL | 0 refills | Status: AC

## 2020-04-25 MED ORDER — AZITHROMYCIN 250 MG PO TABS: 250.0000 mg | ORAL_TABLET | Freq: Every day | ORAL | 0 refills | Status: DC

## 2020-04-25 NOTE — ED Triage Notes (Signed)
Patient c/o sore throat that started yesterday.  Patient denies any other cold symptoms.  Patient denies fevers.

## 2020-04-25 NOTE — ED Provider Notes (Signed)
[facility]    CSN: [id number] Arrival date & time: 04/25/20  0858      History   Chief Complaint Chief Complaint  Patient presents with  . Sore Throat    HPI Mary Clayton is a 44 y.o. female presenting for sore throat x 1 day.  She denies any associated fever, fatigue, body aches, runny nose, cough, headaches, chest pain or being difficulty, abdominal pain, nausea or vomiting, diarrhea.  Patient has taken ibuprofen for pain with some relief.  Patient denies any known Covid or strep exposure.  She states that she get strep throat usually once a year and her symptoms are consistent with that.  Patient is otherwise healthy with history of hypertension and anxiety.  No other complaints or concerns today.  HPI  Past Medical History:  Diagnosis Date  . Anxiety   . Hypertension     There are no problems to display for this patient.   History reviewed. No pertinent surgical history.  OB History   No obstetric history on file.      Home Medications    Prior to Admission medications   Medication Sig Start Date End Date Taking? Authorizing Provider  amLODipine (NORVASC) 10 MG tablet Take 10 mg by mouth daily. 05/30/19  Yes [provider]  PARoxetine (PAXIL) 10 MG tablet Take 10 mg by mouth daily. 06/01/19  Yes [provider]  PARoxetine (PAXIL) 40 MG tablet Take 40 mg by mouth daily. 05/01/19  Yes [provider]  azithromycin (ZITHROMAX) 250 MG tablet Take 1 tablet (250 mg total) by mouth daily. Take first 2 tablets together, then 1 every day until finished. 04/25/20   Shirlee Latch, PA-C  fluconazole (DIFLUCAN) 150 MG tablet Take 1 tablet (150 mg total) by mouth daily for 1 day. 04/25/20 04/26/20  Shirlee Latch, PA-C    Family History Family History  Problem Relation Age of Onset  . Stroke Mother     Social History Social History   Tobacco Use  . Smoking status: Never Smoker  . Smokeless tobacco: Never Used    Substance Use Topics  . Alcohol use: No  . Drug use: No     Allergies   Patient has no known allergies.   Review of Systems Review of Systems  Constitutional: Negative for chills, diaphoresis, fatigue and fever.  HENT: Positive for sore throat. Negative for congestion, ear pain, rhinorrhea, sinus pressure and sinus pain.   Respiratory: Negative for cough and shortness of breath.   Gastrointestinal: Negative for abdominal pain, nausea and vomiting.  Musculoskeletal: Negative for arthralgias and myalgias.  Skin: Negative for rash.  Neurological: Negative for weakness and headaches.  Hematological: Negative for adenopathy.     Physical Exam Triage Vital Signs ED Triage Vitals  Enc Vitals Group     BP 04/25/20 0913 132/75     Pulse Rate 04/25/20 0913 70     Resp 04/25/20 0913 14     Temp 04/25/20 0913 98.9 F (37.2 C)     Temp Source 04/25/20 0913 Oral     SpO2 04/25/20 0913 97 %     Weight 04/25/20 0911 240 lb (108.9 kg)     Height 04/25/20 0911 5\' 9"  (1.753 m)     Head Circumference --      Peak Flow --      Pain Score 04/25/20 0910 7     Pain Loc --      Pain Edu? --  Excl. in GC? --    No data found.  Updated Vital Signs BP 132/75 (BP Location: Left Arm)   Pulse 70   Temp 98.9 F (37.2 C) (Oral)   Resp 14   Ht 5\' 9"  (1.753 m)   Wt 240 lb (108.9 kg)   LMP 04/11/2020 (Approximate)   SpO2 97%   BMI 35.44 kg/m       Physical Exam Vitals and nursing note reviewed.  Constitutional:      General: She is not in acute distress.    Appearance: Normal appearance. She is not ill-appearing or toxic-appearing.  HENT:     Head: Normocephalic and atraumatic.     Nose: Nose normal.     Mouth/Throat:     Mouth: Mucous membranes are moist.     Pharynx: Oropharynx is clear. Posterior oropharyngeal erythema present.     Tonsils: Tonsillar exudate (whitish exudate left tonsil) present. 2+ on the right. 3+ on the left.  Eyes:     General: No scleral icterus.        Right eye: No discharge.        Left eye: No discharge.     Conjunctiva/sclera: Conjunctivae normal.  Cardiovascular:     Rate and Rhythm: Normal rate and regular rhythm.     Heart sounds: Normal heart sounds.  Pulmonary:     Effort: Pulmonary effort is normal. No respiratory distress.     Breath sounds: Normal breath sounds.  Musculoskeletal:     Cervical back: Neck supple.  Skin:    General: Skin is dry.  Neurological:     General: No focal deficit present.     Mental Status: She is alert. Mental status is at baseline.     Motor: No weakness.     Gait: Gait normal.  Psychiatric:        Mood and Affect: Mood normal.        Behavior: Behavior normal.        Thought Content: Thought content normal.      UC Treatments / Results  Labs (all labs ordered are listed, but only abnormal results are displayed) Labs Reviewed  GROUP A STREP BY PCR    EKG   Radiology No results found.  Procedures Procedures (including critical care time)  Medications Ordered in UC Medications - No data to display  Initial Impression / Assessment and Plan / UC Course  I have reviewed the triage vital signs and the nursing notes.  Pertinent labs & imaging results that were available during my care of the patient were reviewed by me and considered in my medical decision making (see chart for details).   Rapid strep test is negative and patient declines Covid testing.  Patient states that she gets strep throat once a year and this feels exactly like it.  She states that she "knows my body."  She says that she definitely if she has strep and thinks antibiotic would help.  Discussed the accuracy of our molecular rapid strep testing with patient explained that it is possible that it is another short of strep, but it should improve on its own.  Sent azithromycin if she is not feeling better for history taking a couple days or she develops a fever.  Advised to complete course of she starts it.   Patient requesting Diflucan to prevent yeast infection if she starts antibiotic.  Advised her to increase rest and fluid intake.  Advised to consider Covid testing if she develops a cough,  fever or any worsening symptoms.  Patient agreeable.  Final Clinical Impressions(s) / UC Diagnoses   Final diagnoses:  Acute tonsillitis, unspecified etiology  Sore throat   Discharge Instructions   None    ED Prescriptions    Medication Sig Dispense Auth. Provider   azithromycin (ZITHROMAX) 250 MG tablet Take 1 tablet (250 mg total) by mouth daily. Take first 2 tablets together, then 1 every day until finished. 6 tablet Eusebio Friendly B, PA-C   fluconazole (DIFLUCAN) 150 MG tablet Take 1 tablet (150 mg total) by mouth daily for 1 day. 1 tablet Gareth Morgan     PDMP not reviewed this encounter.   Shirlee Latch, PA-C 04/25/20 [phone]

## 2020-06-29 IMAGING — CR DG CHEST 2V
1 series · 2 of 2 positions shown · non-contrast
Comparison: None.

CLINICAL DATA: Cough since last week

EXAM:
CHEST - 2 VIEW

[Series 1: dg chest 2 view · 0.14mm/px · 2 of 2 slices shown]
[im 1/2]
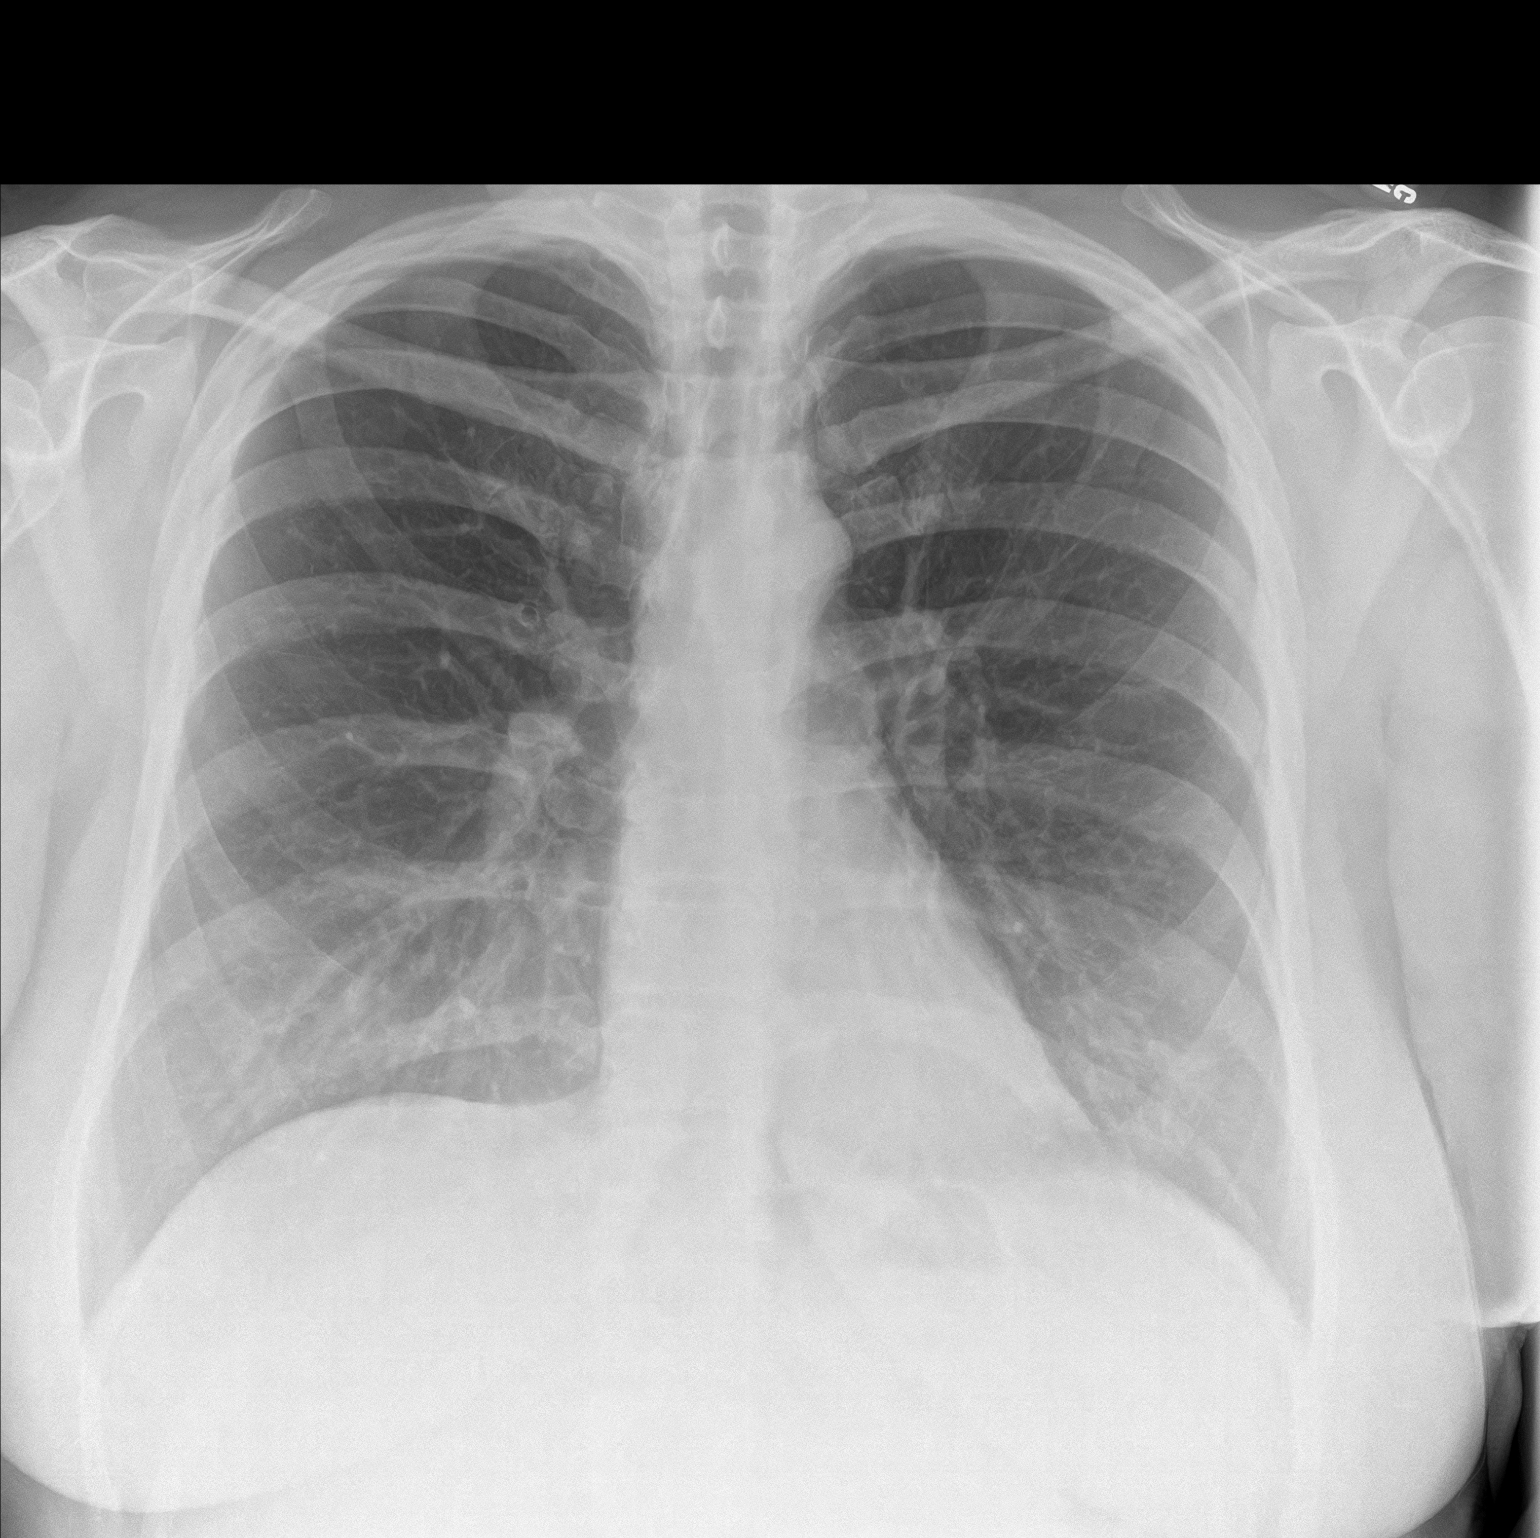
[im 2/2]
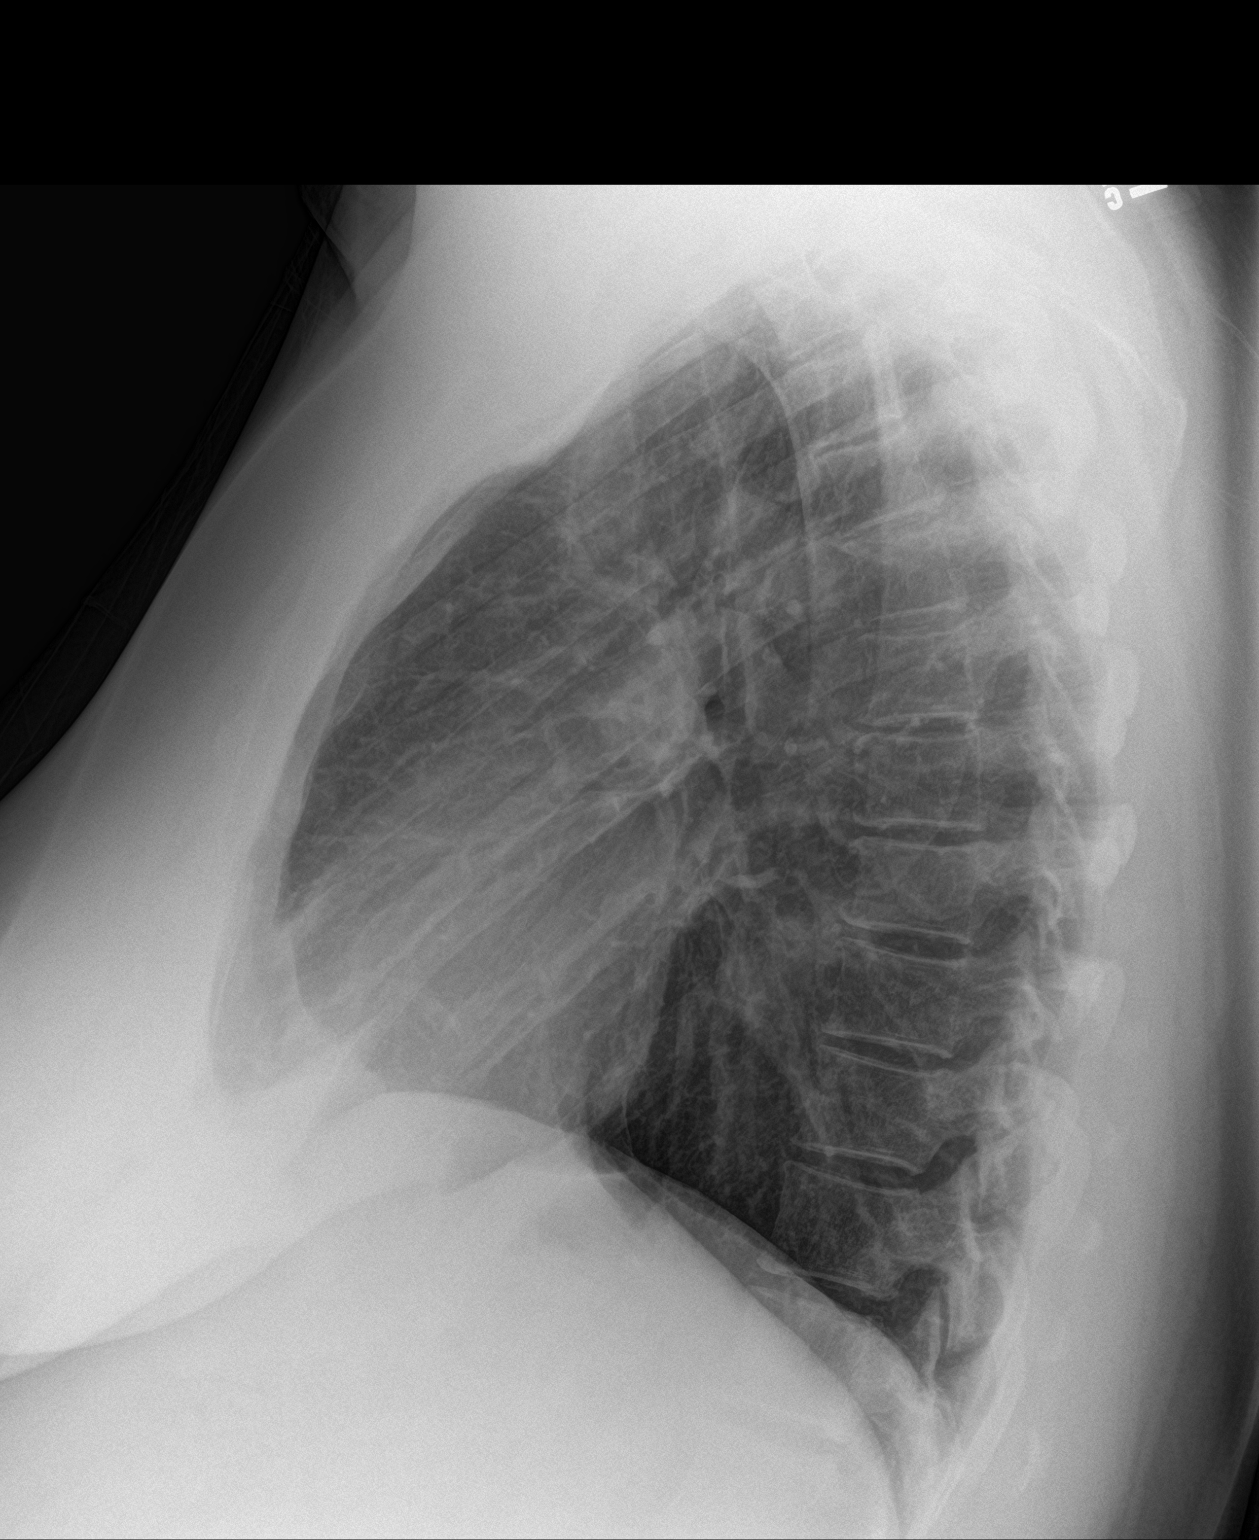

[2 of 2 positions shown; findings below may reference images not displayed]

FINDINGS: The heart size and mediastinal contours are within normal limits.
Mild upper lobe predominant hyperinflation of the lungs with
crowding of lower lobe interstitial lung markings. No alveolar
confluence or edema. No pneumothorax or pleural effusion. The
visualized skeletal structures are unremarkable.
IMPRESSION: Mild upper lobe predominant hyperinflation of the lungs.

## 2020-07-11 ENCOUNTER — Ambulatory Visit
Admission: EM | Admit: 2020-07-11 | Discharge: 2020-07-11 | Disposition: A | Discharge status: Home / Self Care | Payer: BC Managed Care – PPO | Attending: Family Medicine | Admitting: Family Medicine

## 2020-07-11 ENCOUNTER — Encounter: Payer: Self-pay | Admitting: Emergency Medicine

## 2020-07-11 ENCOUNTER — Other Ambulatory Visit: Payer: Self-pay

## 2020-07-11 DIAGNOSIS — Z20822 Contact with and (suspected) exposure to covid-19: Secondary | ICD-10-CM | POA: Insufficient documentation

## 2020-07-11 DIAGNOSIS — J069 Acute upper respiratory infection, unspecified: Secondary | ICD-10-CM | POA: Diagnosis present

## 2020-07-11 LAB — GROUP A STREP BY PCR: Group A Strep by PCR: NOT DETECTED

## 2020-07-11 MED ORDER — IPRATROPIUM BROMIDE 0.06 % NA SOLN: 2.0000 | Freq: Four times a day (QID) | NASAL | 0 refills | Status: AC | PRN

## 2020-07-11 NOTE — ED Provider Notes (Signed)
[facility]    CSN: [id number] Arrival date & time: 07/11/20  0936      History   Chief Complaint Chief Complaint  Patient presents with  . Sore Throat   HPI   45 year old female presents with sore throat and congestion and fatigue.  Symptoms started yesterday.  She reports sore throat, fatigue, and congestion.  Patient states that she has a history of recurrent strep pharyngitis.  She is concerned that she has rupture.  Desires testing today.  No sick contacts.  No fever.  No relieving factors.  No other complaints.  Past Medical History:  Diagnosis Date  . Anxiety   . Hypertension      Home Medications    Prior to Admission medications   Medication Sig Start Date End Date Taking? Authorizing Provider  amLODipine (NORVASC) 10 MG tablet Take 10 mg by mouth daily. 05/30/19  Yes [provider]  ipratropium (ATROVENT) 0.06 % nasal spray Place 2 sprays into both nostrils 4 (four) times daily as needed for rhinitis. 07/11/20  Yes Libni Fusaro G, DO  PARoxetine (PAXIL) 10 MG tablet Take 10 mg by mouth daily. 06/01/19  Yes [provider]  PARoxetine (PAXIL) 40 MG tablet Take 40 mg by mouth daily. 05/01/19  Yes [provider]  ALAYCEN 1/35 tablet Take 1 tablet by mouth daily. 06/30/20   [provider]    Family History Family History  Problem Relation Age of Onset  . Stroke Mother     Social History Social History   Tobacco Use  . Smoking status: Never Smoker  . Smokeless tobacco: Never Used  Substance Use Topics  . Alcohol use: No  . Drug use: No     Allergies   Patient has no known allergies.   Review of Systems Review of Systems  Constitutional: Positive for fatigue.  HENT: Positive for congestion and sore throat.    Physical Exam Triage Vital Signs ED Triage Vitals  Enc Vitals Group     BP 07/11/20 1101 127/84     Pulse Rate 07/11/20 1101 68     Resp 07/11/20 1101 18     Temp 07/11/20 1101 98.6 F (37  C)     Temp Source 07/11/20 1101 Oral     SpO2 07/11/20 1101 98 %     Weight 07/11/20 1059 240 lb (108.9 kg)     Height 07/11/20 1059 5\' 9"  (1.753 m)     Head Circumference --      Peak Flow --      Pain Score 07/11/20 1059 0     Pain Loc --      Pain Edu? --      Excl. in GC? --    Updated Vital Signs BP 127/84 (BP Location: Right Arm)   Pulse 68   Temp 98.6 F (37 C) (Oral)   Resp 18   Ht 5\' 9"  (1.753 m)   Wt 108.9 kg   LMP 07/01/2020   SpO2 98%   BMI 35.44 kg/m   Visual Acuity Right Eye Distance:   Left Eye Distance:   Bilateral Distance:    Right Eye Near:   Left Eye Near:    Bilateral Near:     Physical Exam Vitals and nursing note reviewed.  Constitutional:      General: She is not in acute distress.    Appearance: Normal appearance. She is not ill-appearing.  HENT:     Head: Normocephalic and atraumatic.  Right Ear: Tympanic membrane normal.     Left Ear: Tympanic membrane normal.     Mouth/Throat:     Pharynx: Posterior oropharyngeal erythema present. No oropharyngeal exudate.  Eyes:     General:        Right eye: No discharge.        Left eye: No discharge.     Conjunctiva/sclera: Conjunctivae normal.  Cardiovascular:     Rate and Rhythm: Normal rate and regular rhythm.     Heart sounds: No murmur heard.   Pulmonary:     Effort: Pulmonary effort is normal.     Breath sounds: No wheezing, rhonchi or rales.  Neurological:     Mental Status: She is alert.  Psychiatric:        Mood and Affect: Mood normal.        Behavior: Behavior normal.    UC Treatments / Results  Labs (all labs ordered are listed, but only abnormal results are displayed) Labs Reviewed  GROUP A STREP BY PCR  SARS CORONAVIRUS 2 (TAT 6-24 HRS)    EKG   Radiology No results found.  Procedures Procedures (including critical care time)  Medications Ordered in UC Medications - No data to display  Initial Impression / Assessment and Plan / UC Course  I have  reviewed the triage vital signs and the nursing notes.  Pertinent labs & imaging results that were available during my care of the patient were reviewed by me and considered in my medical decision making (see chart for details).    45 year old female presents with a viral URI.  Strep negative.  Treating with atrovent asal spray.  Awaiting send out Covid test.  Final Clinical Impressions(s) / UC Diagnoses   Final diagnoses:  Viral URI     Discharge Instructions     Medication as directed for congestion.  Ibuprofen as needed for pain.  Take care  Dr. Adriana Simas    ED Prescriptions    Medication Sig Dispense Auth. Provider   ipratropium (ATROVENT) 0.06 % nasal spray Place 2 sprays into both nostrils 4 (four) times daily as needed for rhinitis. 15 mL Tommie Sams, DO     PDMP not reviewed this encounter.   Tommie Sams, DO 07/11/20 1213

## 2020-07-11 NOTE — Discharge Instructions (Signed)
Medication as directed for congestion.  Ibuprofen as needed for pain.  Take care  Dr. Adriana Simas

## 2020-07-11 NOTE — ED Triage Notes (Signed)
Patient c/o sore throat, fatigue that started yesterday.

## 2020-07-12 LAB — SARS CORONAVIRUS 2 (TAT 6-24 HRS): SARS Coronavirus 2: NEGATIVE

## 2020-07-17 ENCOUNTER — Ambulatory Visit
Admission: EM | Admit: 2020-07-17 | Discharge: 2020-07-17 | Disposition: A | Discharge status: Home / Self Care | Payer: BC Managed Care – PPO | Attending: Physician Assistant | Admitting: Physician Assistant

## 2020-07-17 ENCOUNTER — Encounter: Payer: Self-pay | Admitting: Emergency Medicine

## 2020-07-17 ENCOUNTER — Other Ambulatory Visit: Payer: Self-pay

## 2020-07-17 DIAGNOSIS — Z20822 Contact with and (suspected) exposure to covid-19: Secondary | ICD-10-CM | POA: Insufficient documentation

## 2020-07-17 DIAGNOSIS — R0981 Nasal congestion: Secondary | ICD-10-CM | POA: Insufficient documentation

## 2020-07-17 DIAGNOSIS — B349 Viral infection, unspecified: Secondary | ICD-10-CM | POA: Diagnosis present

## 2020-07-17 DIAGNOSIS — R5383 Other fatigue: Secondary | ICD-10-CM | POA: Insufficient documentation

## 2020-07-17 NOTE — ED Provider Notes (Signed)
[facility]    CSN: [id number] Arrival date & time: 07/17/20  1625      History   Chief Complaint No chief complaint on file.   HPI Mary Clayton is a 45 y.o. female presenting for 6-day history of fatigue, body aches, and nasal congestion.  Patient states that she was seen in the [place] urgent care at onset of symptoms and had a negative COVID-19 and strep test.  Patient states that 3 days ago she developed a low-grade fever up to 99 degrees and request to be retested for COVID-19.  Patient denies any known exposure to COVID-19 or any sick contacts.  She denies any high-grade fevers, weakness, headaches, sore throat, cough, chest discomfort, shortness of breath, nausea/vomiting/diarrhea, or changes in smell and taste.  Patient not vaccinated for COVID-19.  She has past medical history of anxiety and hypertension, but no other significant medical problems.  No other complaints or concerns today.  HPI  Past Medical History:  Diagnosis Date  . Anxiety   . Hypertension     There are no problems to display for this patient.   History reviewed. No pertinent surgical history.  OB History   No obstetric history on file.      Home Medications    Prior to Admission medications   Medication Sig Start Date End Date Taking? Authorizing Provider  ALAYCEN 1/35 tablet Take 1 tablet by mouth daily. 06/30/20  Yes [provider]  amLODipine (NORVASC) 10 MG tablet Take 10 mg by mouth daily. 05/30/19  Yes [provider]  ipratropium (ATROVENT) 0.06 % nasal spray Place 2 sprays into both nostrils 4 (four) times daily as needed for rhinitis. 07/11/20  Yes Cook, Jayce G, DO  PARoxetine (PAXIL) 10 MG tablet Take 10 mg by mouth daily. 06/01/19  Yes [provider]  PARoxetine (PAXIL) 40 MG tablet Take 40 mg by mouth daily. 05/01/19  Yes [provider]    Family History Family History  Problem Relation Age of Onset  . Stroke Mother      Social History Social History   Tobacco Use  . Smoking status: Never Smoker  . Smokeless tobacco: Never Used  Substance Use Topics  . Alcohol use: No  . Drug use: No     Allergies   Patient has no known allergies.   Review of Systems Review of Systems  Constitutional: Positive for fatigue and fever. Negative for chills and diaphoresis.  HENT: Positive for congestion. Negative for ear pain, rhinorrhea, sinus pressure, sinus pain and sore throat.   Respiratory: Negative for cough and shortness of breath.   Gastrointestinal: Negative for abdominal pain, nausea and vomiting.  Musculoskeletal: Negative for arthralgias and myalgias.  Skin: Negative for rash.  Neurological: Negative for weakness and headaches.  Hematological: Negative for adenopathy.     Physical Exam Triage Vital Signs ED Triage Vitals  Enc Vitals Group     BP 07/17/20 1802 (!) 140/92     Pulse Rate 07/17/20 1802 67     Resp 07/17/20 1802 18     Temp 07/17/20 1802 99.1 F (37.3 C)     Temp Source 07/17/20 1802 Oral     SpO2 07/17/20 1802 100 %     Weight 07/17/20 1800 240 lb (108.9 kg)     Height 07/17/20 1800 5\' 9"  (1.753 m)     Head Circumference --      Peak Flow --      Pain Score 07/17/20 1800 0  Pain Loc --      Pain Edu? --      Excl. in [place]? --    No data found.  Updated Vital Signs BP (!) 140/92 (BP Location: Right Arm)   Pulse 67   Temp 99.1 F (37.3 C) (Oral)   Resp 18   Ht 5\' 9"  (1.753 m)   Wt 240 lb (108.9 kg)   LMP 07/01/2020   SpO2 100%   BMI 35.44 kg/m    Physical Exam Vitals and nursing note reviewed.  Constitutional:      General: She is not in acute distress.    Appearance: Normal appearance. She is ill-appearing. She is not toxic-appearing or diaphoretic.  HENT:     Head: Normocephalic and atraumatic.     Nose: Nose normal.     Mouth/Throat:     Mouth: Mucous membranes are moist.     Pharynx: Oropharynx is clear.  Eyes:     General: No scleral icterus.        Right eye: No discharge.        Left eye: No discharge.     Conjunctiva/sclera: Conjunctivae normal.  Cardiovascular:     Rate and Rhythm: Normal rate and regular rhythm.     Heart sounds: Normal heart sounds.  Pulmonary:     Effort: Pulmonary effort is normal. No respiratory distress.     Breath sounds: Normal breath sounds.  Musculoskeletal:     Cervical back: Neck supple.  Skin:    General: Skin is dry.  Neurological:     General: No focal deficit present.     Mental Status: She is alert. Mental status is at baseline.     Motor: No weakness.     Gait: Gait normal.  Psychiatric:        Mood and Affect: Mood normal.        Behavior: Behavior normal.        Thought Content: Thought content normal.      UC Treatments / Results  Labs (all labs ordered are listed, but only abnormal results are displayed) Labs Reviewed  SARS CORONAVIRUS 2 (TAT 6-24 HRS)    EKG   Radiology No results found.  Procedures Procedures (including critical care time)  Medications Ordered in UC Medications - No data to display  Initial Impression / Assessment and Plan / UC Course  I have reviewed the triage vital signs and the nursing notes.  Pertinent labs & imaging results that were available during my care of the patient were reviewed by me and considered in my medical decision making (see chart for details).  All vital signs stable.  Blood pressure is elevated 140/92.  Temperature is 99.1 degrees.  She is ill-appearing, but not toxic.  The rest of the exam is benign.  COVID-19 testing performed.  Patient knows when asked results.  Current CDC guidelines, isolation protocol, and ED precautions reviewed with patient.  Patient requests work note to go back to work on Tuesday.  Patient declines any medications.  She says that Dr. Lacinda Axon prescribed her a decongestant, but she did not take it because she has been feeling little better.  Advised her to follow-up with our clinic for worsening  symptoms.    Final Clinical Impressions(s) / UC Diagnoses   Final diagnoses:  Viral illness  Fatigue, unspecified type  Nasal congestion     Discharge Instructions     URI/COLD SYMPTOMS: Your exam today is consistent with a viral illness. Antibiotics are not  indicated at this time. Use medications as directed, including cough syrup, nasal saline, and decongestants. Your symptoms should improve over the next few days and resolve within 7-10 days. Increase rest and fluids. F/u if symptoms worsen or predominate such as sore throat, ear pain, productive cough, shortness of breath, or if you develop high fevers or worsening fatigue over the next several days.    You have received COVID testing today either for positive exposure, concerning symptoms that could be related to COVID infection, screening purposes, or re-testing after confirmed positive.  Your test obtained today checks for active viral infection in the last 1-2 weeks. If your test is negative now, you can still test positive later. So, if you do develop symptoms you should either get re-tested and/or isolate x 5 days and then strict mask use x 5 days (unvaccinated) or mask use x 10 days (vaccinated). Please follow CDC guidelines.  While Rapid antigen tests come back in 15-20 minutes, send out PCR/molecular test results typically come back within 1-3 days. In the mean time, if you are symptomatic, assume this could be a positive test and treat/monitor yourself as if you do have COVID.   We will call with test results if positive. Please download the MyChart app and set up a profile to access test results.   If symptomatic, go home and rest. Push fluids. Take Tylenol as needed for discomfort. Gargle warm salt water. Throat lozenges. Take Mucinex DM or Robitussin for cough. Humidifier in bedroom to ease coughing. Warm showers. Also review the COVID handout for more information.  COVID-19 INFECTION: The incubation period of COVID-19 is  approximately 14 days after exposure, with most symptoms developing in roughly 4-5 days. Symptoms may range in severity from mild to critically severe. Roughly 80% of those infected will have mild symptoms. People of any age may become infected with COVID-19 and have the ability to transmit the virus. The most common symptoms include: fever, fatigue, cough, body aches, headaches, sore throat, nasal congestion, shortness of breath, nausea, vomiting, diarrhea, changes in smell and/or taste.    COURSE OF ILLNESS Some patients may begin with mild disease which can progress quickly into critical symptoms. If your symptoms are worsening please call ahead to the Emergency Department and proceed there for further treatment. Recovery time appears to be roughly 1-2 weeks for mild symptoms and 3-6 weeks for severe disease.   GO IMMEDIATELY TO ER FOR FEVER YOU ARE UNABLE TO GET DOWN WITH TYLENOL, BREATHING PROBLEMS, CHEST PAIN, FATIGUE, LETHARGY, INABILITY TO EAT OR DRINK, ETC  QUARANTINE AND ISOLATION: To help decrease the spread of COVID-19 please remain isolated if you have COVID infection or are highly suspected to have COVID infection. This means -stay home and isolate to one room in the home if you live with others. Do not share a bed or bathroom with others while ill, sanitize and wipe down all countertops and keep common areas clean and disinfected. Stay home for 5 days. If you have no symptoms or your symptoms are resolving after 5 days, you can leave your house. Continue to wear a mask around others for 5 additional days. If you have been in close contact (within 6 feet) of someone diagnosed with COVID 19, you are advised to quarantine in your home for 14 days as symptoms can develop anywhere from 2-14 days after exposure to the virus. If you develop symptoms, you  must isolate.  Most current guidelines for COVID after exposure -unvaccinated: isolate 5 days  and strict mask use x 5 days. Test on day 5 is  possible -vaccinated: wear mask x 10 days if symptoms do not develop -You do not necessarily need to be tested for COVID if you have + exposure and  develop symptoms. Just isolate at home x10 days from symptom onset During this global pandemic, CDC advises to practice social distancing, try to stay at least 44ft away from others at all times. Wear a face covering. Wash and sanitize your hands regularly and avoid going anywhere that is not necessary.  KEEP IN MIND THAT THE COVID TEST IS NOT 100% ACCURATE AND YOU SHOULD STILL DO EVERYTHING TO PREVENT POTENTIAL SPREAD OF VIRUS TO OTHERS (WEAR MASK, WEAR GLOVES, WASH HANDS AND SANITIZE REGULARLY). IF INITIAL TEST IS NEGATIVE, THIS MAY NOT MEAN YOU ARE DEFINITELY NEGATIVE. MOST ACCURATE TESTING IS DONE 5-7 DAYS AFTER EXPOSURE.   It is not advised by CDC to get re-tested after receiving a positive COVID test since you can still test positive for weeks to months after you have already cleared the virus.   *If you have not been vaccinated for COVID, I strongly suggest you consider getting vaccinated as long as there are no contraindications.      ED Prescriptions    None     PDMP not reviewed this encounter.   Shirlee Latch, PA-C 07/17/20 [place]

## 2020-07-17 NOTE — ED Triage Notes (Addendum)
Patient was seen on 01/01 and was tested for COVID and strep and both were negative. She states she now has a low grade fever and generalized body aches and wants to be retested for COVID.

## 2020-07-17 NOTE — Discharge Instructions (Addendum)

## 2020-07-18 LAB — SARS CORONAVIRUS 2 (TAT 6-24 HRS): SARS Coronavirus 2: NEGATIVE

## 2020-07-25 ENCOUNTER — Other Ambulatory Visit: Payer: Self-pay | Admitting: Family Medicine

## 2021-01-05 ENCOUNTER — Ambulatory Visit
Admission: EM | Admit: 2021-01-05 | Discharge: 2021-01-05 | Disposition: A | Discharge status: Home / Self Care | Payer: BC Managed Care – PPO | Attending: Sports Medicine | Admitting: Sports Medicine

## 2021-01-05 ENCOUNTER — Other Ambulatory Visit: Payer: Self-pay

## 2021-01-05 DIAGNOSIS — R509 Fever, unspecified: Secondary | ICD-10-CM | POA: Diagnosis present

## 2021-01-05 DIAGNOSIS — R5383 Other fatigue: Secondary | ICD-10-CM | POA: Insufficient documentation

## 2021-01-05 DIAGNOSIS — J029 Acute pharyngitis, unspecified: Secondary | ICD-10-CM | POA: Diagnosis present

## 2021-01-05 DIAGNOSIS — Z20822 Contact with and (suspected) exposure to covid-19: Secondary | ICD-10-CM | POA: Diagnosis present

## 2021-01-05 LAB — POCT RAPID STREP A: Streptococcus, Group A Screen (Direct): NEGATIVE

## 2021-01-05 NOTE — Discharge Instructions (Addendum)
As we discussed, we will obtain a strep test and someone will contact you if it is positive and sent in antibiotics. In the meantime, given your COVID exposure you can await the results of your test at [facility].  I would call your primary care doctor if they come back positive for further instructions. Regarding your fatigue and swinging fevers I would make an appointment to see your primary care provider to have this further worked up. As we discussed, fatigue can be a situation that requires more testing outside of the urgent care setting.  Again contact your primary care provider and make an appointment to address your chronic fatigue. If your symptoms were to worsen then you should go to the ER if you cannot get into your primary care provider.

## 2021-01-05 NOTE — ED Provider Notes (Signed)
[facility]    CSN: [id number] Arrival date & time: 01/05/21  1510      History   Chief Complaint Chief Complaint  Patient presents with   Fatigue    HPI Mary Clayton is a 45 y.o. female.   Patient is a 45 year old female who presents for evaluation of 10 days of intermittent fevers, fatigue, sore throat, and nasal congestion.  Normally sees Dr. Einar Crow for her ongoing care but she has not seen them for this.  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  She says that her symptoms began about 10 days ago.  She was exposed to COVID-19 from her sister who tested positive.  She reports having 5 home test that have always been negative.  She went to [facility] yesterday and had a PCR test but is still pending.  She comes in today looking for answers and is somewhat upset during the history and physical exam.  She denies any urinary or abdominal symptoms.  No chest pain or shortness of breath.  She says "I just want to feel better".  No red flag signs or symptoms elicited on history.   Past Medical History:  Diagnosis Date   Anxiety    Hypertension     There are no problems to display for this patient.   History reviewed. No pertinent surgical history.  OB History   No obstetric history on file.      Home Medications    Prior to Admission medications   Medication Sig Start Date End Date Taking? Authorizing Provider  ALAYCEN 1/35 tablet Take 1 tablet by mouth daily. 06/30/20  Yes [provider]  amLODipine (NORVASC) 10 MG tablet Take 10 mg by mouth daily. 05/30/19  Yes [provider]  PARoxetine (PAXIL) 10 MG tablet Take 10 mg by mouth daily. 06/01/19  Yes [provider]  PARoxetine (PAXIL) 40 MG tablet Take 40 mg by mouth daily. 05/01/19  Yes [provider]  ipratropium (ATROVENT) 0.06 % nasal spray Place 2 sprays into both nostrils 4 (four) times daily as needed for rhinitis. 07/11/20   Tommie Sams,  DO    Family History Family History  Problem Relation Age of Onset   Stroke Mother     Social History Social History   Tobacco Use   Smoking status: Never   Smokeless tobacco: Never  Substance Use Topics   Alcohol use: No   Drug use: No     Allergies   Patient has no known allergies.   Review of Systems Review of Systems  Constitutional:  Positive for chills, diaphoresis, fatigue and fever. Negative for activity change and appetite change.  HENT:  Positive for congestion and sore throat. Negative for ear pain, postnasal drip, rhinorrhea, sinus pressure, sinus pain and sneezing.   Eyes:  Negative for pain.  Respiratory:  Negative for cough, chest tightness, shortness of breath and wheezing.   Cardiovascular:  Negative for chest pain and palpitations.  Gastrointestinal:  Negative for abdominal pain, diarrhea, nausea and vomiting.  Genitourinary:  Negative for dysuria, frequency, hematuria, pelvic pain, vaginal bleeding, vaginal discharge and vaginal pain.  Musculoskeletal:  Positive for myalgias. Negative for back pain and neck pain.  Skin:  Negative for color change, pallor, rash and wound.  Neurological:  Negative for dizziness, seizures, light-headedness, numbness and headaches.  All other systems reviewed and are negative.   Physical Exam Triage Vital Signs ED Triage Vitals  Enc Vitals Group  BP 01/05/21 1528 (!) 144/92     Pulse Rate 01/05/21 1528 68     Resp 01/05/21 1528 18     Temp 01/05/21 1528 99.7 F (37.6 C)     Temp Source 01/05/21 1528 Oral     SpO2 01/05/21 1528 97 %     Weight 01/05/21 1526 240 lb (108.9 kg)     Height 01/05/21 1526 5\' 9"  (1.753 m)     Head Circumference --      Peak Flow --      Pain Score 01/05/21 1526 0     Pain Loc --      Pain Edu? --      Excl. in [place]? --    No data found.  Updated Vital Signs BP (!) 144/92 (BP Location: Right Arm)   Pulse 68   Temp 99.7 F (37.6 C) (Oral)   Resp 18   Ht 5\' 9"  (1.753 m)   Wt  108.9 kg   LMP 11/11/2020   SpO2 97%   BMI 35.44 kg/m   Visual Acuity Right Eye Distance:   Left Eye Distance:   Bilateral Distance:    Right Eye Near:   Left Eye Near:    Bilateral Near:     Physical Exam Vitals and nursing note reviewed.  Constitutional:      General: She is not in acute distress.    Appearance: Normal appearance. She is not ill-appearing, toxic-appearing or diaphoretic.  HENT:     Head: Normocephalic and atraumatic.     Nose: Nose normal. No congestion.     Mouth/Throat:     Mouth: Mucous membranes are moist.     Pharynx: Posterior oropharyngeal erythema present. No oropharyngeal exudate.  Eyes:     General: No scleral icterus.       Right eye: No discharge.        Left eye: No discharge.     Extraocular Movements: Extraocular movements intact.     Conjunctiva/sclera: Conjunctivae normal.     Pupils: Pupils are equal, round, and reactive to light.  Cardiovascular:     Rate and Rhythm: Normal rate and regular rhythm.     Pulses: Normal pulses.     Heart sounds: Normal heart sounds. No murmur heard.   No friction rub. No gallop.  Pulmonary:     Effort: Pulmonary effort is normal.     Breath sounds: Normal breath sounds. No stridor. No wheezing, rhonchi or rales.  Musculoskeletal:     Cervical back: Normal range of motion and neck supple. No rigidity or tenderness.  Lymphadenopathy:     Cervical: Cervical adenopathy present.  Skin:    General: Skin is warm and dry.     Capillary Refill: Capillary refill takes less than 2 seconds.     Coloration: Skin is not jaundiced.     Findings: No erythema or rash.  Neurological:     General: No focal deficit present.     Mental Status: She is alert and oriented to person, place, and time.     UC Treatments / Results  Labs (all labs ordered are listed, but only abnormal results are displayed) Labs Reviewed  CULTURE, GROUP A STREP [facility])  POCT RAPID STREP A, ED / UC  POCT RAPID STREP A     EKG   Radiology No results found.  Procedures Procedures (including critical care time)  Medications Ordered in UC Medications - No data to display  Initial Impression / Assessment and Plan /  UC Course  I have reviewed the triage vital signs and the nursing notes.  Pertinent labs & imaging results that were available during my care of the patient were reviewed by me and considered in my medical decision making (see chart for details).  Clinical impression: 1.  10 days of intermittent fevers after a exposure to a confirmed case of COVID-19.  Patient's had 5 home test that were negative.  She is waiting on a PCR through [facility] as we speak now. 2.  Sore throat 3.  Fatigue for 10 days after exposure to COVID-19.  Treatment plan: 1.  The findings and treatment plan were discussed in detail with the patient.  Patient was in agreement. 2.  Recommended getting a strep test.  The lab was closed when she presented to the clinic.  We did a point-of-care at the bedside which was negative.  We will send off the culture to the hospital.  Someone will contact her if she is positive and prescribe antibiotics. 3.  We discussed doing a COVID test but she has one pending through [facility].  She will continue to check on that. 4.  Regarding her swinging fevers and fatigue, if her COVID test is negative then I would recommend she see her primary care provider for further outpatient work-up. 5.  Educational handouts provided. 6.  Plenty of rest, plenty fluids, Tylenol or Motrin for fever or discomfort. 7.  If her symptoms were to worsen that she should go to the ER. 8.  She was discharged in stable condition and she will follow-up here as needed.    Final Clinical Impressions(s) / UC Diagnoses   Final diagnoses:  Fatigue, unspecified type  Febrile illness  Sore throat  Exposure to confirmed case of COVID-19     Discharge Instructions      As we discussed, we will obtain a strep test  and someone will contact you if it is positive and sent in antibiotics. In the meantime, given your COVID exposure you can await the results of your test at [facility].  I would call your primary care doctor if they come back positive for further instructions. Regarding your fatigue and swinging fevers I would make an appointment to see your primary care provider to have this further worked up. As we discussed, fatigue can be a situation that requires more testing outside of the urgent care setting.  Again contact your primary care provider and make an appointment to address your chronic fatigue. If your symptoms were to worsen then you should go to the ER if you cannot get into your primary care provider.     ED Prescriptions   None    PDMP not reviewed this encounter.   Delton See, MD 01/06/21 1620

## 2021-01-05 NOTE — ED Triage Notes (Signed)
Patient states that she has been having fatigue, fever x 10 days. States that she has had 5 negative home tests and then had a PCR test done yesterday but is awaiting results. States that her symptoms come and go by the days.

## 2021-01-07 LAB — CULTURE, GROUP A STREP (THRC)

## 2021-11-08 ENCOUNTER — Ambulatory Visit
Admission: EM | Admit: 2021-11-08 | Discharge: 2021-11-08 | Disposition: A | Discharge status: Home / Self Care | Payer: BC Managed Care – PPO | Attending: Physician Assistant | Admitting: Physician Assistant

## 2021-11-08 DIAGNOSIS — Z20822 Contact with and (suspected) exposure to covid-19: Secondary | ICD-10-CM | POA: Diagnosis not present

## 2021-11-08 DIAGNOSIS — R051 Acute cough: Secondary | ICD-10-CM | POA: Diagnosis not present

## 2021-11-08 DIAGNOSIS — R0981 Nasal congestion: Secondary | ICD-10-CM | POA: Diagnosis not present

## 2021-11-08 DIAGNOSIS — J069 Acute upper respiratory infection, unspecified: Secondary | ICD-10-CM

## 2021-11-08 MED ORDER — FLUTICASONE PROPIONATE 50 MCG/ACT NA SUSP: 1.0000 | Freq: Every day | NASAL | 0 refills | Status: AC

## 2021-11-08 MED ORDER — PROMETHAZINE-DM 6.25-15 MG/5ML PO SYRP: 5.0000 mL | ORAL_SOLUTION | Freq: Two times a day (BID) | ORAL | 0 refills | Status: AC | PRN

## 2021-11-08 NOTE — ED Provider Notes (Signed)
?Post ? ? ? ?CSN: [id number] ?Arrival date & time: 11/08/21  1843 ? ? ?  ? ?History   ?Chief Complaint ?Chief Complaint  ?Patient presents with  ? Nasal Congestion  ? ? ?HPI ?Mary Clayton is a 46 y.o. female.  ? ?Patient presents today with a 2-day history of URI symptoms.  Reports nasal congestion, cough, fatigue, malaise.  Denies any chest pain, shortness of breath, nausea, vomiting, diarrhea.  Denies any known sick contacts but does have an 47-year-old in school and is a Pharmacist, hospital herself.  She has had COVID approximately 4 months ago.  She has had COVID vaccines.  She has history of allergies but does not take medication for this on a regular basis.  Denies history of asthma, COPD, smoking.  Denies any recent antibiotic use.  She has tried Advil without improvement of symptoms.  She has missed work as result of symptoms and is requesting work excuse note. ? ? ?Past Medical History:  ?Diagnosis Date  ? Anxiety   ? Hypertension   ? ? ?Patient Active Problem List  ? Diagnosis Date Noted  ? Healthcare maintenance 05/30/2019  ? Dysmenorrhea 11/20/2018  ? Morbid obesity, BMI not known ([place]) 11/20/2018  ? Essential hypertension 03/17/2018  ? Panic anxiety syndrome 03/17/2018  ? Prediabetes 12/26/2016  ? ? ?History reviewed. No pertinent surgical history. ? ?OB History   ?No obstetric history on file. ?  ? ? ? ?Home Medications   ? ?Prior to Admission medications   ?Medication Sig Start Date End Date Taking? Authorizing Provider  ?ALAYCEN 1/35 tablet Take 1 tablet by mouth daily. 06/30/20  Yes [provider]  ?fluticasone (FLONASE) 50 MCG/ACT nasal spray Place 1 spray into both nostrils daily. 11/08/21  Yes Ajna Moors, Derry Skill, PA-C  ?norethindrone-ethinyl estradiol (CYCLAFEM) 0.5/0.75/1-35 MG-MCG tablet Take 1 tablet by mouth daily. 06/10/21  Yes [provider]  ?PARoxetine (PAXIL) 10 MG tablet Take 10 mg by mouth daily. 06/01/19  Yes [provider]  ?PARoxetine (PAXIL) 40 MG tablet  Take 40 mg by mouth daily. 05/01/19  Yes [provider]  ?promethazine-dextromethorphan (PROMETHAZINE-DM) 6.25-15 MG/5ML syrup Take 5 mLs by mouth 2 (two) times daily as needed for cough. 11/08/21  Yes Voshon Petro K, PA-C  ?tiZANidine (ZANAFLEX) 4 MG tablet Take 4 mg by mouth at bedtime as needed. 09/06/21  Yes [provider]  ?amLODipine (NORVASC) 10 MG tablet Take 10 mg by mouth daily. 05/30/19   [provider]  ?Ocie Bob AG HOME TEST KIT See admin instructions. 09/13/21   [provider]  ?ipratropium (ATROVENT) 0.06 % nasal spray Place 2 sprays into both nostrils 4 (four) times daily as needed for rhinitis. 07/11/20   Coral Spikes, DO  ? ? ?Family History ?Family History  ?Problem Relation Age of Onset  ? Stroke Mother   ? ? ?Social History ?Social History  ? ?Tobacco Use  ? Smoking status: Never  ? Smokeless tobacco: Never  ?Vaping Use  ? Vaping Use: Never used  ?Substance Use Topics  ? Alcohol use: No  ? Drug use: No  ? ? ? ?Allergies   ?Patient has no known allergies. ? ? ?Review of Systems ?Review of Systems  ?Constitutional:  Positive for activity change and fatigue. Negative for appetite change and fever.  ?HENT:  Positive for congestion, postnasal drip and sinus pressure. Negative for sneezing and sore throat.   ?Respiratory:  Positive for cough. Negative for shortness of breath.   ?Cardiovascular:  Negative for chest pain.  ?Gastrointestinal:  Negative for abdominal pain, diarrhea, nausea and vomiting.  ?Neurological:  Positive for headaches. Negative for dizziness and light-headedness.  ? ? ?Physical Exam ?Triage Vital Signs ?ED Triage Vitals  ?Enc Vitals Group  ?   BP 11/08/21 1942 (!) 152/106  ?   Pulse Rate 11/08/21 1942 74  ?   Resp 11/08/21 1942 18  ?   Temp 11/08/21 1942 98.1 ?F (36.7 ?C)  ?   Temp Source 11/08/21 1942 Oral  ?   SpO2 11/08/21 1942 97 %  ?   Weight 11/08/21 1940 230 lb (104.3 kg)  ?   Height 11/08/21 1940 5' 9"  (1.753 m)  ?   Head  Circumference --   ?   Peak Flow --   ?   Pain Score 11/08/21 1938 0  ?   Pain Loc --   ?   Pain Edu? --   ?   Excl. in [place]? --   ? ?No data found. ? ?Updated Vital Signs ?BP (!) 157/108 (BP Location: Left Arm)   Pulse 74   Temp 98.1 ?F (36.7 ?C) (Oral)   Resp 18   Ht 5' 9"  (1.753 m)   Wt 230 lb (104.3 kg)   LMP  (LMP Unknown)   SpO2 97%   BMI 33.97 kg/m?  ? ?Visual Acuity ?Right Eye Distance:   ?Left Eye Distance:   ?Bilateral Distance:   ? ?Right Eye Near:   ?Left Eye Near:    ?Bilateral Near:    ? ?Physical Exam ?Vitals reviewed.  ?Constitutional:   ?   General: She is awake. She is not in acute distress. ?   Appearance: Normal appearance. She is well-developed. She is not ill-appearing.  ?   Comments: Very pleasant female appears stated age in no acute distress sitting comfortably in exam room  ?HENT:  ?   Head: Normocephalic and atraumatic.  ?   Right Ear: Tympanic membrane, ear canal and external ear normal. Tympanic membrane is not erythematous or bulging.  ?   Left Ear: Tympanic membrane, ear canal and external ear normal. Tympanic membrane is not erythematous or bulging.  ?   Nose:  ?   Right Sinus: Maxillary sinus tenderness present. No frontal sinus tenderness.  ?   Left Sinus: Maxillary sinus tenderness present. No frontal sinus tenderness.  ?   Mouth/Throat:  ?   Pharynx: Uvula midline. Posterior oropharyngeal erythema present. No oropharyngeal exudate.  ?Cardiovascular:  ?   Rate and Rhythm: Normal rate and regular rhythm.  ?   Heart sounds: Normal heart sounds, S1 normal and S2 normal. No murmur heard. ?Pulmonary:  ?   Effort: Pulmonary effort is normal.  ?   Breath sounds: Normal breath sounds. No wheezing, rhonchi or rales.  ?   Comments: Clear to auscultation bilaterally ?Psychiatric:     ?   Behavior: Behavior is cooperative.  ? ? ? ?UC Treatments / Results  ?Labs ?(all labs ordered are listed, but only abnormal results are displayed) ?Labs Reviewed  ?SARS CORONAVIRUS 2 (TAT 6-24 HRS)   ? ? ?EKG ? ? ?Radiology ?No results found. ? ?Procedures ?Procedures (including critical care time) ? ?Medications Ordered in UC ?Medications - No data to display ? ?Initial Impression / Assessment and Plan / UC Course  ?I have reviewed the triage vital signs and the nursing notes. ? ?Pertinent labs & imaging results that were available during my care of the patient were reviewed by me  and considered in my medical decision making (see chart for details). ? ?  ? ?Viral testing obtained today and pending.  No evidence of acute infection on physical exam that would warrant initiation of antibiotics.  Recommended conservative treatment measures including Flonase, Mucinex, Tylenol for symptom relief.  She was given Promethazine DM for cough with instruction not to drive or drink alcohol while taking this medication as drowsiness is a common side effect.  She is to rest and drink plenty of fluid.  Discussed that if symptoms do not improving within a week she should return for reevaluation.  If anything worsens and she has high fever, chest pain, shortness of breath, nausea/vomiting interfere with oral intake, weakness that she needs to be seen immediately.  Work excuse note provided. ? ?Final Clinical Impressions(s) / UC Diagnoses  ? ?Final diagnoses:  ?Upper respiratory tract infection, unspecified type  ?Nasal congestion  ?Acute cough  ? ? ? ?Discharge Instructions   ? ?  ?I believe that you have a viral upper respiratory infection.  We will contact you if your testing is positive.  Please use Mucinex and Tylenol for symptom relief.  Use Flonase for congestion.  Take Promethazine DM for cough.  This will make you sleepy so do not drive or drink alcohol with taking it.  Make sure you rest and drink plenty of fluid.  If your symptoms or not improving within a week return or see your PCP.  If anything worsens and you have high fever despite medication, chest pain, shortness of breath, nausea/vomiting, you should be seen  immediately. ? ? ? ? ?ED Prescriptions   ? ? Medication Sig Dispense Auth. Provider  ? fluticasone (FLONASE) 50 MCG/ACT nasal spray Place 1 spray into both nostrils daily. 16 g Armani Brar K, PA-C  ? promethazine-dextrom

## 2021-11-08 NOTE — ED Triage Notes (Signed)
Patient is here for "Congestion, Fatigue". ? Sinus infection. Needs work note also. Symptoms started yesterday as "head cold". Congestion, cough, runny nose, fatigue started today.  ?

## 2021-11-08 NOTE — Discharge Instructions (Signed)
I believe that you have a viral upper respiratory infection.  We will contact you if your testing is positive.  Please use Mucinex and Tylenol for symptom relief.  Use Flonase for congestion.  Take Promethazine DM for cough.  This will make you sleepy so do not drive or drink alcohol with taking it.  Make sure you rest and drink plenty of fluid.  If your symptoms or not improving within a week return or see your PCP.  If anything worsens and you have high fever despite medication, chest pain, shortness of breath, nausea/vomiting, you should be seen immediately. ?

## 2021-11-09 LAB — SARS CORONAVIRUS 2 (TAT 6-24 HRS): SARS Coronavirus 2: NEGATIVE
